# Patient Record
Sex: Male | Born: 1935 | Race: White | Hispanic: No | Marital: Married | Smoking: Former smoker
Health system: Southern US, Community
[De-identification: ages and names within clinical notes are randomized; demographics above are authoritative.]

## PROBLEM LIST (undated history)

## (undated) DIAGNOSIS — I10 Essential (primary) hypertension: Secondary | ICD-10-CM

## (undated) DIAGNOSIS — M109 Gout, unspecified: Secondary | ICD-10-CM

## (undated) DIAGNOSIS — G709 Myoneural disorder, unspecified: Secondary | ICD-10-CM

## (undated) DIAGNOSIS — R0989 Other specified symptoms and signs involving the circulatory and respiratory systems: Secondary | ICD-10-CM

## (undated) DIAGNOSIS — R112 Nausea with vomiting, unspecified: Secondary | ICD-10-CM

## (undated) DIAGNOSIS — G629 Polyneuropathy, unspecified: Secondary | ICD-10-CM

## (undated) DIAGNOSIS — E119 Type 2 diabetes mellitus without complications: Secondary | ICD-10-CM

## (undated) DIAGNOSIS — I739 Peripheral vascular disease, unspecified: Secondary | ICD-10-CM

## (undated) DIAGNOSIS — L309 Dermatitis, unspecified: Secondary | ICD-10-CM

## (undated) DIAGNOSIS — E785 Hyperlipidemia, unspecified: Secondary | ICD-10-CM

## (undated) DIAGNOSIS — H3530 Unspecified macular degeneration: Secondary | ICD-10-CM

## (undated) DIAGNOSIS — G4730 Sleep apnea, unspecified: Secondary | ICD-10-CM

## (undated) DIAGNOSIS — Z9889 Other specified postprocedural states: Secondary | ICD-10-CM

## (undated) DIAGNOSIS — I639 Cerebral infarction, unspecified: Secondary | ICD-10-CM

## (undated) DIAGNOSIS — K635 Polyp of colon: Secondary | ICD-10-CM

## (undated) DIAGNOSIS — R054 Cough syncope: Secondary | ICD-10-CM

## (undated) DIAGNOSIS — R51 Headache: Secondary | ICD-10-CM

## (undated) DIAGNOSIS — R05 Cough: Secondary | ICD-10-CM

## (undated) HISTORY — PX: TONSILLECTOMY: SUR1361

## (undated) HISTORY — DX: Other specified symptoms and signs involving the circulatory and respiratory systems: R09.89

## (undated) HISTORY — DX: Polyneuropathy, unspecified: G62.9

## (undated) HISTORY — PX: COLONOSCOPY: SHX174

## (undated) HISTORY — DX: Cough syncope: R05.4

## (undated) HISTORY — PX: SPINE SURGERY: SHX786

## (undated) HISTORY — DX: Gout, unspecified: M10.9

## (undated) HISTORY — DX: Type 2 diabetes mellitus without complications: E11.9

## (undated) HISTORY — DX: Dermatitis, unspecified: L30.9

## (undated) HISTORY — DX: Unspecified macular degeneration: H35.30

## (undated) HISTORY — DX: Cough: R05

## (undated) HISTORY — DX: Essential (primary) hypertension: I10

## (undated) HISTORY — DX: Polyp of colon: K63.5

## (undated) HISTORY — DX: Peripheral vascular disease, unspecified: I73.9

## (undated) HISTORY — DX: Hyperlipidemia, unspecified: E78.5

## (undated) HISTORY — PX: UMBILICAL HERNIA REPAIR: SHX196

## (undated) HISTORY — PX: TRIGGER FINGER RELEASE: SHX641

## (undated) HISTORY — PX: SHOULDER SURGERY: SHX246

## (undated) HISTORY — DX: Cerebral infarction, unspecified: I63.9

---

## 2006-08-11 HISTORY — PX: CATARACT EXTRACTION: SUR2

## 2008-10-02 ENCOUNTER — Encounter: Admission: RE | Admit: 2008-10-02 | Discharge: 2008-10-02 | Payer: Self-pay | Admitting: Orthopedic Surgery

## 2008-10-05 ENCOUNTER — Ambulatory Visit (HOSPITAL_BASED_OUTPATIENT_CLINIC_OR_DEPARTMENT_OTHER): Admission: RE | Admit: 2008-10-05 | Discharge: 2008-10-05 | Payer: Self-pay | Admitting: Orthopedic Surgery

## 2009-08-11 HISTORY — PX: EYE SURGERY: SHX253

## 2010-04-04 ENCOUNTER — Encounter: Admission: RE | Admit: 2010-04-04 | Discharge: 2010-04-04 | Payer: Self-pay | Admitting: Orthopedic Surgery

## 2010-04-09 ENCOUNTER — Ambulatory Visit (HOSPITAL_BASED_OUTPATIENT_CLINIC_OR_DEPARTMENT_OTHER): Admission: RE | Admit: 2010-04-09 | Discharge: 2010-04-10 | Payer: Self-pay | Admitting: Orthopedic Surgery

## 2010-10-25 LAB — BASIC METABOLIC PANEL
CO2: 25 mEq/L (ref 19–32)
Calcium: 10 mg/dL (ref 8.4–10.5)
Creatinine, Ser: 0.97 mg/dL (ref 0.4–1.5)
GFR calc Af Amer: >60 mL/min (ref >60)
Glucose, Bld: 77 mg/dL (ref 70–99)
Potassium: 4.4 mEq/L (ref 3.5–5.1)
Sodium: 139 mEq/L (ref 135–145)

## 2010-10-25 LAB — POCT HEMOGLOBIN-HEMACUE: Hemoglobin: 16.4 g/dL (ref 13.0–17.0)

## 2010-10-25 LAB — GLUCOSE, CAPILLARY
Glucose-Capillary: 108 mg/dL — ABNORMAL HIGH (ref 70–99)
Glucose-Capillary: 145 mg/dL — ABNORMAL HIGH (ref 70–99)

## 2010-11-26 LAB — BASIC METABOLIC PANEL
CO2: 26 mEq/L (ref 19–32)
Chloride: 107 mEq/L (ref 96–112)
Potassium: 3.7 mEq/L (ref 3.5–5.1)

## 2010-11-26 LAB — POCT HEMOGLOBIN-HEMACUE: Hemoglobin: 16.6 g/dL (ref 13.0–17.0)

## 2010-12-24 NOTE — Op Note (Signed)
NAME:  Caleb Barrett, DAHLEM NO.:  [id number]   MEDICAL RECORD NO.:  [id number]          PATIENT TYPE:  AMB   LOCATION:  DSC                          FACILITY:  [facility]   PHYSICIAN:  Katy Fitch. Sypher, M.D. DATE OF BIRTH:  1935-02-27   DATE OF PROCEDURE:  10/05/2008  DATE OF DISCHARGE:                               OPERATIVE REPORT   PREOPERATIVE DIAGNOSIS:  Chronic stenosing tenosynovitis, left thumb at  A1 pulley.   POSTOPERATIVE DIAGNOSIS:  Chronic stenosing tenosynovitis, left thumb at  A1 pulley.   OPERATION:  Release of left thumb A1 pulley.   OPERATING SURGEON:  Katy Fitch. Sypher, MD.   ASSISTANT:  Marveen Reeks Dasnoit, PA-C   ANESTHESIA:  Lidocaine 2% field block and flexor sheath block of left  thumb supplemented by IV sedation.   SUPERVISING ANESTHESIOLOGIST:  Zenon Mayo, MD   INDICATIONS:  Chevy Sweigert is a 76 year old gentleman referred through  the courtesy of his primary care physician in [place] for  evaluation and management of a locking left thumb.  He has a history of  penicillin allergy and chronic stenosing tenosynovitis.  He has failed  nonoperative measures including steroid injection and activity  modification.   He is now brought to the operating room at this time for release of his  left thumb A1 pulley.   PROCEDURE:  Willies Laviolette was brought to the operating room and placed  in supine position on the operating table.   Following light sedation, the left arm was prepped with Betadine soap  and solution, sterilely draped.  A pneumatic tourniquet was applied to  proximal left brachium.   Lidocaine 2% was infiltrated in the path of the intended incision  followed by exsanguination of left arm with Esmarch bandage, inflation  of arterial tourniquet to 275 mmHg due to mild systolic hypertension.   The procedure commenced with a short transverse incision directly over  the A1 pulley.  Inflammatory tissue was cleared off the  pulley and a  blunt rag nail retractor was placed to retract the radial proper digital  nerve.  The pulley was thickened and opaque.  The pulley was split with  scalpel and scissors.  The flexor pollicis longus tendon was delivered  with minor fraying due to chronic compression.   The wound was then repaired with interrupted suture of 5-0 nylon.  A  compressive dressing was applied with Steri-Strips, sterile gauze, and  Ace wrap.   We will encourage Mr. Abdon to begin immediate range of motion  exercises.  We will see him in 1 week for suture removal.  For  aftercare, he is provided a prescription for Darvocet-N 100 one p.o. q.4-  6 h. p.r.n. pain, 20 tablets without refill.      Katy Fitch Sypher, M.D.  Electronically Signed     RVS/MEDQ  D:  10/05/2008  T:  10/05/2008  Job:  [id number]

## 2011-12-29 DIAGNOSIS — R0989 Other specified symptoms and signs involving the circulatory and respiratory systems: Secondary | ICD-10-CM

## 2011-12-29 HISTORY — DX: Other specified symptoms and signs involving the circulatory and respiratory systems: R09.89

## 2012-09-14 ENCOUNTER — Other Ambulatory Visit: Payer: Self-pay | Admitting: *Deleted

## 2012-09-14 DIAGNOSIS — M79609 Pain in unspecified limb: Secondary | ICD-10-CM

## 2012-09-14 DIAGNOSIS — R55 Syncope and collapse: Secondary | ICD-10-CM

## 2012-09-15 ENCOUNTER — Other Ambulatory Visit: Payer: Self-pay

## 2012-09-15 ENCOUNTER — Encounter: Payer: Self-pay | Admitting: Vascular Surgery

## 2012-09-17 ENCOUNTER — Encounter: Payer: Self-pay | Admitting: Vascular Surgery

## 2012-09-21 ENCOUNTER — Encounter: Payer: Self-pay | Admitting: Vascular Surgery

## 2012-09-22 ENCOUNTER — Other Ambulatory Visit: Payer: Self-pay

## 2012-09-22 ENCOUNTER — Encounter: Payer: Self-pay | Admitting: Vascular Surgery

## 2012-10-27 ENCOUNTER — Encounter: Payer: Self-pay | Admitting: Vascular Surgery

## 2012-10-27 ENCOUNTER — Other Ambulatory Visit: Payer: Self-pay

## 2012-11-15 ENCOUNTER — Encounter: Payer: Self-pay | Admitting: Vascular Surgery

## 2012-11-16 ENCOUNTER — Encounter (INDEPENDENT_AMBULATORY_CARE_PROVIDER_SITE_OTHER): Payer: Medicare Other | Admitting: *Deleted

## 2012-11-16 ENCOUNTER — Other Ambulatory Visit (INDEPENDENT_AMBULATORY_CARE_PROVIDER_SITE_OTHER): Payer: Medicare Other | Admitting: *Deleted

## 2012-11-16 ENCOUNTER — Ambulatory Visit (INDEPENDENT_AMBULATORY_CARE_PROVIDER_SITE_OTHER): Payer: Medicare Other | Admitting: Vascular Surgery

## 2012-11-16 ENCOUNTER — Encounter: Payer: Self-pay | Admitting: Vascular Surgery

## 2012-11-16 VITALS — BP 91/69 | HR 92 | Wt 241.8 lb

## 2012-11-16 DIAGNOSIS — R55 Syncope and collapse: Secondary | ICD-10-CM

## 2012-11-16 DIAGNOSIS — M79609 Pain in unspecified limb: Secondary | ICD-10-CM

## 2012-11-16 NOTE — Addendum Note (Signed)
Addended by: Adria Dill L on: 11/16/2012 04:28 PM   Modules accepted: Orders
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
## 2013-05-11 HISTORY — PX: BACK SURGERY: SHX140

## 2013-05-18 ENCOUNTER — Other Ambulatory Visit: Payer: Self-pay | Admitting: Neurosurgery

## 2013-05-23 ENCOUNTER — Encounter (HOSPITAL_COMMUNITY): Payer: Self-pay | Admitting: Pharmacy Technician

## 2013-05-25 ENCOUNTER — Other Ambulatory Visit (HOSPITAL_COMMUNITY): Payer: Self-pay | Admitting: *Deleted

## 2013-05-25 NOTE — Pre-Procedure Instructions (Signed)
Caleb Barrett  05/25/2013   Your procedure is scheduled on:  Thursday, June 02, 2013 at 12:15 PM.   Report to [facility] Entrance "A" at 9:15 AM.   Call this number if you have problems the morning of surgery: [phone]   Remember:   Do not eat food or drink liquids after midnight.   Take these medicines the morning of surgery with A SIP OF WATER: gabapentin (NEURONTIN)  Stop all Vitamins, Herbal Medications, Fish Oil, Aspirin, Plavix and NSAIDS as of today, 05/26/13.     Do not wear jewelry.  Do not wear lotions, powders, or cologne. You may wear deodorant.  Do not shave 48 hours prior to surgery. Men may shave face and neck.  Do not bring valuables to the hospital.  [facility] is not responsible                  for any belongings or valuables.               Contacts, dentures or bridgework may not be worn into surgery.  Leave suitcase in the car. After surgery it may be brought to your room.  For patients admitted to the hospital, discharge time is determined by your                treatment team.                Special Instructions: Shower using CHG 2 nights before surgery and the night before surgery.  If you shower the day of surgery use CHG.  Use special wash - you have one bottle of CHG for all showers.  You should use approximately 1/3 of the bottle for each shower.   Please read over the following fact sheets that you were given: Pain Booklet, Coughing and Deep Breathing, Blood Transfusion Information, MRSA Information and Surgical Site Infection Prevention

## 2013-05-26 ENCOUNTER — Encounter (HOSPITAL_COMMUNITY): Payer: Self-pay

## 2013-05-26 ENCOUNTER — Encounter (HOSPITAL_COMMUNITY)
Admission: RE | Admit: 2013-05-26 | Discharge: 2013-05-26 | Disposition: A | Discharge status: Home / Self Care | Payer: Medicare Other | Source: Ambulatory Visit | Attending: Neurosurgery | Admitting: Neurosurgery

## 2013-05-26 DIAGNOSIS — Z01810 Encounter for preprocedural cardiovascular examination: Secondary | ICD-10-CM | POA: Insufficient documentation

## 2013-05-26 DIAGNOSIS — Z01812 Encounter for preprocedural laboratory examination: Secondary | ICD-10-CM | POA: Insufficient documentation

## 2013-05-26 DIAGNOSIS — Z01818 Encounter for other preprocedural examination: Secondary | ICD-10-CM | POA: Insufficient documentation

## 2013-05-26 HISTORY — DX: Myoneural disorder, unspecified: G70.9

## 2013-05-26 HISTORY — DX: Sleep apnea, unspecified: G47.30

## 2013-05-26 HISTORY — DX: Other specified postprocedural states: R11.2

## 2013-05-26 HISTORY — DX: Headache: R51

## 2013-05-26 HISTORY — DX: Other specified postprocedural states: Z98.890

## 2013-05-26 LAB — BASIC METABOLIC PANEL
BUN: 20 mg/dL (ref 6–23)
CO2: 27 mEq/L (ref 19–32)
Calcium: 10.3 mg/dL (ref 8.4–10.5)
Chloride: 98 mEq/L (ref 96–112)
Creatinine, Ser: 1.12 mg/dL (ref 0.50–1.35)
GFR calc Af Amer: 72 mL/min — ABNORMAL LOW (ref >90)
GFR calc non Af Amer: 62 mL/min — ABNORMAL LOW (ref >90)
Potassium: 4.3 mEq/L (ref 3.5–5.1)

## 2013-05-26 LAB — CBC
HCT: 48.2 % (ref 39.0–52.0)
Hemoglobin: 16.2 g/dL (ref 13.0–17.0)
MCHC: 33.6 g/dL (ref 30.0–36.0)
MCV: 86.8 fL (ref 78.0–100.0)
Platelets: 161 10*3/uL (ref 150–400)
RBC: 5.55 MIL/uL (ref 4.22–5.81)
RDW: 14.3 % (ref 11.5–15.5)
WBC: 8.1 10*3/uL (ref 4.0–10.5)

## 2013-05-26 LAB — TYPE AND SCREEN
ABO/RH(D): O POS
Antibody Screen: NEGATIVE

## 2013-05-26 LAB — SURGICAL PCR SCREEN
MRSA, PCR: NEGATIVE
Staphylococcus aureus: NEGATIVE

## 2013-05-26 LAB — ABO/RH: ABO/RH(D): O POS

## 2013-05-26 NOTE — Progress Notes (Addendum)
PCP Dr Harl Bowie.  Denies seeing a Cardiologist.  Denies having a card cath, echo, or stress test.   No recent EKG or CXR noted. Voices understanding of pre-admit instructions.

## 2013-05-27 ENCOUNTER — Encounter (HOSPITAL_COMMUNITY): Payer: Self-pay

## 2013-05-27 NOTE — Progress Notes (Signed)
Anesthesia Chart Review:  Patient is a 78 year old male scheduled for L2-5 decompression/PLIF on 06/02/13 by Dr. Venetia Maxon.    History includes former smoker, obesity, HLD, HTN, DM2, peripheral neuropathy, CVA, OSA, cough syncope '12, macular degeneration, PAD with occluded left ICA (by notes), gout, right eye cataract extraction, tonsillectomy, UHR, right rotator cuff repair '11.  Negative stress echo in 2003 by PCP notes. PCP is Dr. Lasandra Beech.  He is followed by neurologist Dr. Aletha Halim at [facility] who cleared him for this procedure with permission to hold Plavix.  EKG on 05/26/13 showed NSR, possible anterior infarct (age undetermined). His rate was increased and r wave in V3 is lower when compared to 04/04/10.  PCP notes from 04/29/13 state his carotid duplex on 12/29/11 ws unchanged--showing occluded left ICA.  (Copy of latest report requested, but is currently pending.)  CXR on 05/26/13 showed findings consistent with COPD, no acute abnormality.  Preoperative labs noted.   He has been cleared by his neurologist.  No CV symptoms documented at his PAT visit. Further evaluation by his assigned anesthesiologist on the day of surgery, but if no acute changes then I would anticipate that he could proceed as planned.  Velna Ochs [facility] Short Stay Center/Anesthesiology Phone [phone] 05/27/2013 4:12 PM

## 2013-06-01 MED ORDER — VANCOMYCIN HCL 10 G IV SOLR
1500.0000 mg | INTRAVENOUS | Status: AC
  Administered 2013-06-02: 1500 mg via INTRAVENOUS
  Filled 2013-06-01 (×2): qty 1500

## 2013-06-02 ENCOUNTER — Ambulatory Visit (HOSPITAL_COMMUNITY): Payer: Medicare Other

## 2013-06-02 ENCOUNTER — Encounter (HOSPITAL_COMMUNITY)
Admission: RE | Disposition: A | Discharge status: Home Health | Payer: Medicare HMO | Source: Ambulatory Visit | Attending: Neurosurgery

## 2013-06-02 ENCOUNTER — Inpatient Hospital Stay (HOSPITAL_COMMUNITY)
Admission: RE | Admit: 2013-06-02 | Discharge: 2013-06-07 | DRG: 460 | Disposition: A | Discharge status: Home Health | Payer: Medicare Other | Source: Ambulatory Visit | Attending: Neurosurgery | Admitting: Neurosurgery

## 2013-06-02 ENCOUNTER — Encounter (HOSPITAL_COMMUNITY): Payer: Self-pay | Admitting: Anesthesiology

## 2013-06-02 ENCOUNTER — Encounter (HOSPITAL_COMMUNITY): Payer: Medicare Other | Admitting: Vascular Surgery

## 2013-06-02 ENCOUNTER — Ambulatory Visit (HOSPITAL_COMMUNITY): Payer: Medicare Other | Admitting: Anesthesiology

## 2013-06-02 DIAGNOSIS — I658 Occlusion and stenosis of other precerebral arteries: Secondary | ICD-10-CM | POA: Diagnosis present

## 2013-06-02 DIAGNOSIS — Z79899 Other long term (current) drug therapy: Secondary | ICD-10-CM

## 2013-06-02 DIAGNOSIS — G4730 Sleep apnea, unspecified: Secondary | ICD-10-CM | POA: Diagnosis present

## 2013-06-02 DIAGNOSIS — Z87891 Personal history of nicotine dependence: Secondary | ICD-10-CM

## 2013-06-02 DIAGNOSIS — I6529 Occlusion and stenosis of unspecified carotid artery: Secondary | ICD-10-CM | POA: Diagnosis present

## 2013-06-02 DIAGNOSIS — E669 Obesity, unspecified: Secondary | ICD-10-CM | POA: Diagnosis present

## 2013-06-02 DIAGNOSIS — I10 Essential (primary) hypertension: Secondary | ICD-10-CM | POA: Diagnosis present

## 2013-06-02 DIAGNOSIS — Z7902 Long term (current) use of antithrombotics/antiplatelets: Secondary | ICD-10-CM

## 2013-06-02 DIAGNOSIS — E119 Type 2 diabetes mellitus without complications: Secondary | ICD-10-CM | POA: Diagnosis present

## 2013-06-02 DIAGNOSIS — M47817 Spondylosis without myelopathy or radiculopathy, lumbosacral region: Secondary | ICD-10-CM | POA: Diagnosis present

## 2013-06-02 DIAGNOSIS — Z7982 Long term (current) use of aspirin: Secondary | ICD-10-CM

## 2013-06-02 DIAGNOSIS — R339 Retention of urine, unspecified: Secondary | ICD-10-CM | POA: Diagnosis not present

## 2013-06-02 DIAGNOSIS — Z8673 Personal history of transient ischemic attack (TIA), and cerebral infarction without residual deficits: Secondary | ICD-10-CM

## 2013-06-02 DIAGNOSIS — M4310 Spondylolisthesis, site unspecified: Principal | ICD-10-CM | POA: Diagnosis present

## 2013-06-02 DIAGNOSIS — E785 Hyperlipidemia, unspecified: Secondary | ICD-10-CM | POA: Diagnosis present

## 2013-06-02 LAB — GLUCOSE, CAPILLARY
Glucose-Capillary: 97 mg/dL (ref 70–99)
Glucose-Capillary: 113 mg/dL — ABNORMAL HIGH (ref 70–99)

## 2013-06-02 SURGERY — POSTERIOR LUMBAR FUSION 3 LEVEL
Anesthesia: General | Site: Spine Lumbar | Wound class: Clean

## 2013-06-02 MED ORDER — MIDAZOLAM HCL 2 MG/2ML IJ SOLN
0.5000 mg | Freq: Once | INTRAMUSCULAR | Status: AC | PRN
  Administered 2013-06-02: 2 mg via INTRAVENOUS

## 2013-06-02 MED ORDER — FLEET ENEMA 7-19 GM/118ML RE ENEM: 1.0000 | ENEMA | Freq: Once | RECTAL | Status: AC | PRN

## 2013-06-02 MED ORDER — CLOPIDOGREL BISULFATE 75 MG PO TABS
75.0000 mg | ORAL_TABLET | Freq: Every day | ORAL | Status: DC
  Administered 2013-06-02 – 2013-06-07 (×6): 75 mg via ORAL
  Filled 2013-06-02 (×6): qty 1

## 2013-06-02 MED ORDER — SODIUM CHLORIDE 0.9 % IJ SOLN: 3.0000 mL | INTRAMUSCULAR | Status: DC | PRN

## 2013-06-02 MED ORDER — ALBUMIN HUMAN 5 % IV SOLN
INTRAVENOUS | Status: DC | PRN
  Administered 2013-06-02: 15:00:00 via INTRAVENOUS

## 2013-06-02 MED ORDER — ONDANSETRON HCL 4 MG/2ML IJ SOLN
INTRAMUSCULAR | Status: DC | PRN
  Administered 2013-06-02: 4 mg via INTRAVENOUS

## 2013-06-02 MED ORDER — MEPERIDINE HCL 25 MG/ML IJ SOLN: 6.2500 mg | INTRAMUSCULAR | Status: DC | PRN

## 2013-06-02 MED ORDER — LACTATED RINGERS IV SOLN
INTRAVENOUS | Status: DC | PRN
  Administered 2013-06-02 (×3): via INTRAVENOUS

## 2013-06-02 MED ORDER — PROMETHAZINE HCL 25 MG/ML IJ SOLN: 6.2500 mg | INTRAMUSCULAR | Status: DC | PRN

## 2013-06-02 MED ORDER — INSULIN ASPART 100 UNIT/ML ~~LOC~~ SOLN: 0.0000 [IU] | Freq: Every day | SUBCUTANEOUS | Status: DC

## 2013-06-02 MED ORDER — POLYETHYLENE GLYCOL 3350 17 G PO PACK
17.0000 g | PACK | Freq: Every day | ORAL | Status: DC | PRN
  Administered 2013-06-05 – 2013-06-07 (×2): 17 g via ORAL
  Filled 2013-06-02 (×2): qty 1

## 2013-06-02 MED ORDER — LIDOCAINE HCL (CARDIAC) 20 MG/ML IV SOLN
INTRAVENOUS | Status: DC | PRN
  Administered 2013-06-02: 100 mg via INTRAVENOUS

## 2013-06-02 MED ORDER — BUPIVACAINE HCL (PF) 0.5 % IJ SOLN
INTRAMUSCULAR | Status: DC | PRN
  Administered 2013-06-02: 5 mL

## 2013-06-02 MED ORDER — SODIUM CHLORIDE 0.9 % IV SOLN
INTRAVENOUS | Status: DC | PRN
  Administered 2013-06-02: 12:00:00 via INTRAVENOUS

## 2013-06-02 MED ORDER — ASPIRIN 81 MG PO TABS: 81.0000 mg | ORAL_TABLET | Freq: Every day | ORAL | Status: DC

## 2013-06-02 MED ORDER — LISINOPRIL-HYDROCHLOROTHIAZIDE 20-12.5 MG PO TABS: 2.0000 | ORAL_TABLET | Freq: Every day | ORAL | Status: DC

## 2013-06-02 MED ORDER — PANTOPRAZOLE SODIUM 40 MG IV SOLR
40.0000 mg | Freq: Every day | INTRAVENOUS | Status: DC
  Administered 2013-06-02: 40 mg via INTRAVENOUS
  Filled 2013-06-02 (×2): qty 40

## 2013-06-02 MED ORDER — NEOSTIGMINE METHYLSULFATE 1 MG/ML IJ SOLN
INTRAMUSCULAR | Status: DC | PRN
  Administered 2013-06-02: 5 mg via INTRAVENOUS

## 2013-06-02 MED ORDER — DIAZEPAM 5 MG PO TABS
5.0000 mg | ORAL_TABLET | Freq: Four times a day (QID) | ORAL | Status: DC | PRN
  Administered 2013-06-02 – 2013-06-07 (×7): 5 mg via ORAL
  Filled 2013-06-02 (×7): qty 1

## 2013-06-02 MED ORDER — MORPHINE SULFATE 2 MG/ML IJ SOLN
1.0000 mg | INTRAMUSCULAR | Status: DC | PRN
  Administered 2013-06-02: 2 mg via INTRAVENOUS
  Administered 2013-06-02: 4 mg via INTRAVENOUS
  Administered 2013-06-05: 2 mg via INTRAVENOUS
  Filled 2013-06-02 (×2): qty 1
  Filled 2013-06-02: qty 2

## 2013-06-02 MED ORDER — DOCUSATE SODIUM 100 MG PO CAPS
100.0000 mg | ORAL_CAPSULE | Freq: Two times a day (BID) | ORAL | Status: DC
  Administered 2013-06-02 – 2013-06-07 (×9): 100 mg via ORAL
  Filled 2013-06-02 (×10): qty 1

## 2013-06-02 MED ORDER — INSULIN ASPART 100 UNIT/ML ~~LOC~~ SOLN
0.0000 [IU] | Freq: Three times a day (TID) | SUBCUTANEOUS | Status: DC
  Administered 2013-06-03: 3 [IU] via SUBCUTANEOUS
  Administered 2013-06-03 – 2013-06-06 (×6): 2 [IU] via SUBCUTANEOUS

## 2013-06-02 MED ORDER — SIMVASTATIN 20 MG PO TABS
20.0000 mg | ORAL_TABLET | Freq: Every day | ORAL | Status: DC
  Administered 2013-06-02 – 2013-06-06 (×5): 20 mg via ORAL
  Filled 2013-06-02 (×6): qty 1

## 2013-06-02 MED ORDER — SODIUM CHLORIDE 0.9 % IV SOLN
250.0000 mL | INTRAVENOUS | Status: DC
  Administered 2013-06-06: 250 mL via INTRAVENOUS

## 2013-06-02 MED ORDER — DIAZEPAM 5 MG PO TABS
ORAL_TABLET | ORAL | Status: AC
  Administered 2013-06-02: 5 mg via ORAL
  Filled 2013-06-02: qty 1

## 2013-06-02 MED ORDER — OXYCODONE HCL 5 MG/5ML PO SOLN: 5.0000 mg | Freq: Once | ORAL | Status: DC | PRN

## 2013-06-02 MED ORDER — METFORMIN HCL 500 MG PO TABS
500.0000 mg | ORAL_TABLET | Freq: Every day | ORAL | Status: DC
  Administered 2013-06-03 – 2013-06-07 (×5): 500 mg via ORAL
  Filled 2013-06-02 (×6): qty 1

## 2013-06-02 MED ORDER — PHENOL 1.4 % MT LIQD: 1.0000 | OROMUCOSAL | Status: DC | PRN

## 2013-06-02 MED ORDER — ACETAMINOPHEN 650 MG RE SUPP: 650.0000 mg | RECTAL | Status: DC | PRN

## 2013-06-02 MED ORDER — VANCOMYCIN HCL IN DEXTROSE 1-5 GM/200ML-% IV SOLN
1000.0000 mg | Freq: Two times a day (BID) | INTRAVENOUS | Status: AC
  Administered 2013-06-03 (×2): 1000 mg via INTRAVENOUS
  Filled 2013-06-02 (×3): qty 200

## 2013-06-02 MED ORDER — THROMBIN 20000 UNITS EX SOLR
CUTANEOUS | Status: DC | PRN
  Administered 2013-06-02: 14:00:00 via TOPICAL

## 2013-06-02 MED ORDER — ALLOPURINOL 300 MG PO TABS
300.0000 mg | ORAL_TABLET | Freq: Every day | ORAL | Status: DC
  Administered 2013-06-02 – 2013-06-07 (×6): 300 mg via ORAL
  Filled 2013-06-02 (×6): qty 1

## 2013-06-02 MED ORDER — HYDROMORPHONE HCL PF 1 MG/ML IJ SOLN
0.2500 mg | INTRAMUSCULAR | Status: DC | PRN
  Administered 2013-06-02: 0.5 mg via INTRAVENOUS

## 2013-06-02 MED ORDER — 0.9 % SODIUM CHLORIDE (POUR BTL) OPTIME
TOPICAL | Status: DC | PRN
  Administered 2013-06-02 (×2): 1000 mL

## 2013-06-02 MED ORDER — LISINOPRIL 40 MG PO TABS
40.0000 mg | ORAL_TABLET | Freq: Every day | ORAL | Status: DC
  Administered 2013-06-02 – 2013-06-07 (×4): 40 mg via ORAL
  Filled 2013-06-02 (×6): qty 1

## 2013-06-02 MED ORDER — GLYCOPYRROLATE 0.2 MG/ML IJ SOLN
INTRAMUSCULAR | Status: DC | PRN
  Administered 2013-06-02: .6 mg via INTRAVENOUS

## 2013-06-02 MED ORDER — ZOLPIDEM TARTRATE 5 MG PO TABS: 5.0000 mg | ORAL_TABLET | Freq: Every evening | ORAL | Status: DC | PRN

## 2013-06-02 MED ORDER — SODIUM CHLORIDE 0.9 % IJ SOLN
3.0000 mL | Freq: Two times a day (BID) | INTRAMUSCULAR | Status: DC
  Administered 2013-06-02 – 2013-06-07 (×4): 3 mL via INTRAVENOUS

## 2013-06-02 MED ORDER — GABAPENTIN 300 MG PO CAPS
300.0000 mg | ORAL_CAPSULE | Freq: Two times a day (BID) | ORAL | Status: DC
  Administered 2013-06-02 – 2013-06-07 (×10): 300 mg via ORAL
  Filled 2013-06-02 (×12): qty 1

## 2013-06-02 MED ORDER — BISACODYL 10 MG RE SUPP
10.0000 mg | Freq: Every day | RECTAL | Status: DC | PRN
  Administered 2013-06-07: 10 mg via RECTAL
  Filled 2013-06-02: qty 1

## 2013-06-02 MED ORDER — LACTATED RINGERS IV SOLN
INTRAVENOUS | Status: DC
  Administered 2013-06-02: 09:00:00 via INTRAVENOUS

## 2013-06-02 MED ORDER — ONDANSETRON HCL 4 MG/2ML IJ SOLN
4.0000 mg | INTRAMUSCULAR | Status: DC | PRN
  Administered 2013-06-03: 4 mg via INTRAVENOUS
  Filled 2013-06-02: qty 2

## 2013-06-02 MED ORDER — MIDAZOLAM HCL 2 MG/2ML IJ SOLN
INTRAMUSCULAR | Status: AC
  Administered 2013-06-02: 2 mg via INTRAVENOUS
  Filled 2013-06-02: qty 2

## 2013-06-02 MED ORDER — ARTIFICIAL TEARS OP OINT
TOPICAL_OINTMENT | OPHTHALMIC | Status: DC | PRN
  Administered 2013-06-02: 1 via OPHTHALMIC

## 2013-06-02 MED ORDER — LIDOCAINE-EPINEPHRINE 1 %-1:100000 IJ SOLN
INTRAMUSCULAR | Status: DC | PRN
  Administered 2013-06-02: 5 mL

## 2013-06-02 MED ORDER — ALUM & MAG HYDROXIDE-SIMETH 200-200-20 MG/5ML PO SUSP: 30.0000 mL | Freq: Four times a day (QID) | ORAL | Status: DC | PRN

## 2013-06-02 MED ORDER — OXYCODONE HCL 5 MG PO TABS: 5.0000 mg | ORAL_TABLET | Freq: Once | ORAL | Status: DC | PRN

## 2013-06-02 MED ORDER — HYDROCHLOROTHIAZIDE 25 MG PO TABS
25.0000 mg | ORAL_TABLET | Freq: Every day | ORAL | Status: DC
  Administered 2013-06-02 – 2013-06-07 (×4): 25 mg via ORAL
  Filled 2013-06-02 (×6): qty 1

## 2013-06-02 MED ORDER — PROPOFOL 10 MG/ML IV BOLUS
INTRAVENOUS | Status: DC | PRN
  Administered 2013-06-02: 150 mg via INTRAVENOUS

## 2013-06-02 MED ORDER — PHENYLEPHRINE HCL 10 MG/ML IJ SOLN
10.0000 mg | INTRAVENOUS | Status: DC | PRN
  Administered 2013-06-02: 10 ug/min via INTRAVENOUS

## 2013-06-02 MED ORDER — ALBUTEROL SULFATE HFA 108 (90 BASE) MCG/ACT IN AERS
INHALATION_SPRAY | RESPIRATORY_TRACT | Status: DC | PRN
  Administered 2013-06-02: 2 via RESPIRATORY_TRACT

## 2013-06-02 MED ORDER — OXYCODONE-ACETAMINOPHEN 5-325 MG PO TABS
ORAL_TABLET | ORAL | Status: AC
  Administered 2013-06-02: 1 via ORAL
  Filled 2013-06-02: qty 1

## 2013-06-02 MED ORDER — HYDROMORPHONE HCL PF 1 MG/ML IJ SOLN
INTRAMUSCULAR | Status: AC
  Administered 2013-06-02: 0.5 mg via INTRAVENOUS
  Filled 2013-06-02: qty 1

## 2013-06-02 MED ORDER — ASPIRIN 81 MG PO CHEW
81.0000 mg | CHEWABLE_TABLET | Freq: Every day | ORAL | Status: DC
  Administered 2013-06-03 – 2013-06-07 (×5): 81 mg via ORAL
  Filled 2013-06-02 (×5): qty 1

## 2013-06-02 MED ORDER — HYDROCODONE-ACETAMINOPHEN 5-325 MG PO TABS
1.0000 | ORAL_TABLET | ORAL | Status: DC | PRN
  Administered 2013-06-03: 1 via ORAL
  Administered 2013-06-04 – 2013-06-07 (×8): 2 via ORAL
  Filled 2013-06-02 (×7): qty 2
  Filled 2013-06-02: qty 1
  Filled 2013-06-02: qty 2

## 2013-06-02 MED ORDER — KCL IN DEXTROSE-NACL 20-5-0.45 MEQ/L-%-% IV SOLN
INTRAVENOUS | Status: DC
  Administered 2013-06-02 – 2013-06-07 (×6): via INTRAVENOUS
  Filled 2013-06-02 (×11): qty 1000

## 2013-06-02 MED ORDER — OXYCODONE-ACETAMINOPHEN 5-325 MG PO TABS
1.0000 | ORAL_TABLET | ORAL | Status: DC | PRN
  Administered 2013-06-02: 1 via ORAL
  Administered 2013-06-03 – 2013-06-07 (×9): 2 via ORAL
  Filled 2013-06-02 (×4): qty 2
  Filled 2013-06-02: qty 1
  Filled 2013-06-02 (×5): qty 2

## 2013-06-02 MED ORDER — ROCURONIUM BROMIDE 100 MG/10ML IV SOLN
INTRAVENOUS | Status: DC | PRN
  Administered 2013-06-02: 20 mg via INTRAVENOUS
  Administered 2013-06-02: 50 mg via INTRAVENOUS
  Administered 2013-06-02 (×3): 10 mg via INTRAVENOUS

## 2013-06-02 MED ORDER — FENTANYL CITRATE 0.05 MG/ML IJ SOLN
INTRAMUSCULAR | Status: DC | PRN
  Administered 2013-06-02 (×2): 50 ug via INTRAVENOUS
  Administered 2013-06-02: 100 ug via INTRAVENOUS
  Administered 2013-06-02: 50 ug via INTRAVENOUS
  Administered 2013-06-02: 200 ug via INTRAVENOUS
  Administered 2013-06-02 (×2): 50 ug via INTRAVENOUS

## 2013-06-02 MED ORDER — ACETAMINOPHEN 325 MG PO TABS: 650.0000 mg | ORAL_TABLET | ORAL | Status: DC | PRN

## 2013-06-02 MED ORDER — SENNA 8.6 MG PO TABS
1.0000 | ORAL_TABLET | Freq: Two times a day (BID) | ORAL | Status: DC
  Administered 2013-06-02 – 2013-06-07 (×10): 8.6 mg via ORAL
  Filled 2013-06-02 (×11): qty 1

## 2013-06-02 MED ORDER — MENTHOL 3 MG MT LOZG: 1.0000 | LOZENGE | OROMUCOSAL | Status: DC | PRN

## 2013-06-02 MED FILL — Heparin Sodium (Porcine) Inj 1000 Unit/ML: INTRAMUSCULAR | Qty: 30 | Status: AC

## 2013-06-02 MED FILL — Sodium Chloride IV Soln 0.9%: INTRAVENOUS | Qty: 1000 | Status: AC

## 2013-06-02 SURGICAL SUPPLY — 85 items
BAG DECANTER FOR FLEXI CONT (MISCELLANEOUS) IMPLANT
BENZOIN TINCTURE PRP APPL 2/3 (GAUZE/BANDAGES/DRESSINGS) IMPLANT
BLADE SURG ROTATE 9660 (MISCELLANEOUS) IMPLANT
BUR MATCHSTICK NEURO 3.0 LAGG (BURR) ×2 IMPLANT
BUR PRECISION FLUTE 5.0 (BURR) ×2 IMPLANT
BUR ROUND FLUTED 5 RND (BURR) ×2 IMPLANT
CANISTER SUCT 3000ML (MISCELLANEOUS) ×2 IMPLANT
CONT SPEC 4OZ CLIKSEAL STRL BL (MISCELLANEOUS) ×4 IMPLANT
COVER BACK TABLE 24X17X13 BIG (DRAPES) IMPLANT
COVER TABLE BACK 60X90 (DRAPES) ×2 IMPLANT
CROSSOVER SMALL (Orthopedic Implant) ×2 IMPLANT
DERMABOND ADHESIVE PROPEN (GAUZE/BANDAGES/DRESSINGS) ×2
DERMABOND ADVANCED (GAUZE/BANDAGES/DRESSINGS)
DERMABOND ADVANCED .7 DNX12 (GAUZE/BANDAGES/DRESSINGS) IMPLANT
DERMABOND ADVANCED .7 DNX6 (GAUZE/BANDAGES/DRESSINGS) ×2 IMPLANT
DRAPE C-ARM 42X72 X-RAY (DRAPES) ×4 IMPLANT
DRAPE LAPAROTOMY 100X72X124 (DRAPES) ×2 IMPLANT
DRAPE POUCH INSTRU U-SHP 10X18 (DRAPES) ×2 IMPLANT
DRAPE SURG 17X23 STRL (DRAPES) ×2 IMPLANT
DRESSING TELFA 8X3 (GAUZE/BANDAGES/DRESSINGS) IMPLANT
DRSG OPSITE POSTOP 4X8 (GAUZE/BANDAGES/DRESSINGS) ×2 IMPLANT
DURAPREP 26ML APPLICATOR (WOUND CARE) ×2 IMPLANT
ELECT BLADE 4.0 EZ CLEAN MEGAD (MISCELLANEOUS) ×2
ELECT REM PT RETURN 9FT ADLT (ELECTROSURGICAL) ×2
ELECTRODE BLDE 4.0 EZ CLN MEGD (MISCELLANEOUS) ×1 IMPLANT
ELECTRODE REM PT RTRN 9FT ADLT (ELECTROSURGICAL) ×1 IMPLANT
EVACUATOR 1/8 PVC DRAIN (DRAIN) IMPLANT
GAUZE SPONGE 4X4 16PLY XRAY LF (GAUZE/BANDAGES/DRESSINGS) IMPLANT
GLOVE BIO SURGEON STRL SZ7 (GLOVE) ×2 IMPLANT
GLOVE BIO SURGEON STRL SZ8 (GLOVE) ×4 IMPLANT
GLOVE BIOGEL PI IND STRL 6.5 (GLOVE) ×1 IMPLANT
GLOVE BIOGEL PI IND STRL 7.0 (GLOVE) ×1 IMPLANT
GLOVE BIOGEL PI IND STRL 8 (GLOVE) ×4 IMPLANT
GLOVE BIOGEL PI IND STRL 8.5 (GLOVE) ×2 IMPLANT
GLOVE BIOGEL PI INDICATOR 6.5 (GLOVE) ×1
GLOVE BIOGEL PI INDICATOR 7.0 (GLOVE) ×1
GLOVE BIOGEL PI INDICATOR 8 (GLOVE) ×4
GLOVE BIOGEL PI INDICATOR 8.5 (GLOVE) ×2
GLOVE ECLIPSE 7.5 STRL STRAW (GLOVE) ×4 IMPLANT
GLOVE ECLIPSE 8.0 STRL XLNG CF (GLOVE) ×6 IMPLANT
GLOVE EXAM NITRILE LRG STRL (GLOVE) IMPLANT
GLOVE EXAM NITRILE MD LF STRL (GLOVE) IMPLANT
GLOVE EXAM NITRILE XL STR (GLOVE) IMPLANT
GLOVE EXAM NITRILE XS STR PU (GLOVE) IMPLANT
GLOVE SURG SS PI 8.5 STRL IVOR (GLOVE) ×1
GLOVE SURG SS PI 8.5 STRL STRW (GLOVE) ×1 IMPLANT
GOWN BRE IMP SLV AUR LG STRL (GOWN DISPOSABLE) ×2 IMPLANT
GOWN BRE IMP SLV AUR XL STRL (GOWN DISPOSABLE) ×6 IMPLANT
GOWN STRL REIN 2XL LVL4 (GOWN DISPOSABLE) ×8 IMPLANT
KIT BASIN OR (CUSTOM PROCEDURE TRAY) ×2 IMPLANT
KIT INFUSE SMALL (Orthopedic Implant) ×2 IMPLANT
KIT POSITION SURG JACKSON T1 (MISCELLANEOUS) ×2 IMPLANT
KIT ROOM TURNOVER OR (KITS) ×2 IMPLANT
MILL MEDIUM DISP (BLADE) ×2 IMPLANT
NEEDLE HYPO 25X1 1.5 SAFETY (NEEDLE) ×2 IMPLANT
NEEDLE SPNL 18GX3.5 QUINCKE PK (NEEDLE) ×2 IMPLANT
NS IRRIG 1000ML POUR BTL (IV SOLUTION) ×2 IMPLANT
PACK LAMINECTOMY NEURO (CUSTOM PROCEDURE TRAY) ×2 IMPLANT
PAD ARMBOARD 7.5X6 YLW CONV (MISCELLANEOUS) ×6 IMPLANT
PATTIES SURGICAL .5 X.5 (GAUZE/BANDAGES/DRESSINGS) IMPLANT
PATTIES SURGICAL .5 X1 (DISPOSABLE) IMPLANT
PATTIES SURGICAL 1X1 (DISPOSABLE) ×2 IMPLANT
ROD 90MM (Rod) ×2 IMPLANT
SCREW 55MM (Screw) ×2 IMPLANT
SCREW POLYAX 6.5X45MM (Screw) ×14 IMPLANT
SCREW SET SPINAL STD HEXALOBE (Screw) ×16 IMPLANT
SPONGE GAUZE 4X4 12PLY (GAUZE/BANDAGES/DRESSINGS) ×2 IMPLANT
SPONGE LAP 4X18 X RAY DECT (DISPOSABLE) IMPLANT
SPONGE SURGIFOAM ABS GEL 100 (HEMOSTASIS) ×2 IMPLANT
STAPLER SKIN PROX WIDE 3.9 (STAPLE) IMPLANT
STRIP CLOSURE SKIN 1/2X4 (GAUZE/BANDAGES/DRESSINGS) IMPLANT
SUT VIC AB 1 CT1 18XBRD ANBCTR (SUTURE) ×2 IMPLANT
SUT VIC AB 1 CT1 8-18 (SUTURE) ×2
SUT VIC AB 2-0 CT1 18 (SUTURE) ×4 IMPLANT
SUT VIC AB 3-0 SH 8-18 (SUTURE) ×4 IMPLANT
SYR 20CC LL (SYRINGE) ×2 IMPLANT
SYR 3ML LL SCALE MARK (SYRINGE) ×8 IMPLANT
SYR 5ML LL (SYRINGE) ×10 IMPLANT
TAPE CLOTH SURG 4X10 WHT LF (GAUZE/BANDAGES/DRESSINGS) ×2 IMPLANT
TOWEL OR 17X24 6PK STRL BLUE (TOWEL DISPOSABLE) ×2 IMPLANT
TOWEL OR 17X26 10 PK STRL BLUE (TOWEL DISPOSABLE) ×4 IMPLANT
TRAP SPECIMEN MUCOUS 40CC (MISCELLANEOUS) ×2 IMPLANT
TRAY FOLEY CATH 14FRSI W/METER (CATHETERS) IMPLANT
TRAY FOLEY CATH 16FRSI W/METER (SET/KITS/TRAYS/PACK) ×2 IMPLANT
WATER STERILE IRR 1000ML POUR (IV SOLUTION) ×2 IMPLANT

## 2013-06-02 NOTE — H&P (Signed)
> [address] [place], [place] [address] Phone: [phone]   Patient ID:   [id number] Patient: Caleb Barrett  Date of Birth: 1934/09/27 Visit Type: Chart Update   Date: 05/02/2013 11:30 AM Provider: Danae Orleans. Venetia Maxon   Historian: self  This 78 year old male presents for back pain.  HISTORY OF PRESENT ILLNESS: 1.  back pain   78 y.o. male, retired, reports worsening lumbar and BLE pain x72yrs.  Unable to walk >30minutes.  Pt notes increasing weakness BLE. Pt attributes numbness both feet to NIDDM.  ESI's (4) have not helped.   Gabapentin 1200mg  BID helps very little.  NCS 08/2012 MRI 02/2013 & X-ray today on [place].   (Plavix since stroke 5 yrs ago)  Patient complains of low back and bilateral lower chart knee pain.  He says he is unable to walk greater than 10 minutes.  He complains of numbness in her feet.  He notes progressive weakness in both legs over the last year.  He has been taking gabapentin without relief.  He is on Plavix 75 mg daily and has been on this ever since he had a stroke 5 years ago.  On further discussion this sounds like a TIA rather than a stroke as he had no lasting deficit.  He is known to have 100% occlusion of his left carotid artery and 40-50% narrowing of his right carotid artery.  He describes that his problem started in his feet and that he has numbness involving his foot and it is this if he is okay walking on marbles".  He gets relief when he sits down to rest after 10 minutes and when he has severe pain if he leans forward he gets some relief.  He has started falling recently.  MRI of the lumbar spine from July 2014 shows severe spinal stenosis at L23 with an AP diameter of the thecal sac of 5 mm, at L3-L4 there is severe spinal stenosis as well, and that L. 4/5 there is spondylolisthesis and severe spinal stenosis.  There is milder spinal stenosis at L1-L2.  Plain radiographs demonstrate multilevel spondylosis throughout the  lumbar spine.  There is a mobile spondylolisthesis of L4 on L5.  There's 5 mm of anterolisthesis on extension and 9.1 mm on flexion.     PAST MEDICAL/SURGICAL HISTORY  (Detailed)  Disease/disorder Onset Date Management Date Comments    Cataract extraction    Diabetes type 2      Hyperlipidemia      Hypertension      Stroke          PAST MEDICAL HISTORY, SURGICAL HISTORY, FAMILY HISTORY, SOCIAL HISTORY AND REVIEW OF SYSTEMS I have reviewed the patient's past medical, surgical, family and social history as well as the comprehensive review of systems as included on the [facility] history form dated 05/02/2013, which I have signed.  Family History  (Detailed) Patient reports there is no relevant family history.   SOCIAL HISTORY  (Detailed) Tobacco use reviewed. Preferred language is Unknown.   Smoking status: Never smoker.  SMOKING STATUS Use Status Type Smoking Status Usage Per Day Years Used Total Pack Years  no/never  Never smoker             Medications (added, continued or stopped this visit):   Medication Dose Prescribed Else Ind Started Stopped  allopurinol 300 mg tablet 300 mg Y    Aspir-81 81 mg tablet,delayed release 81 mg Y    clopidogrel 75  mg tablet 75 mg Y    gabapentin 600 mg tablet 600 mg Y    lisinopril 20 mg-hydrochlorothiazide 12.5 mg tablet 20 mg-12.5 mg Y    metformin ER 500 mg tablet,extended release 24 hr 500 mg Y    pravastatin 40 mg tablet 40 mg Y       Allergies:  Ingredient Reaction Medication Name Comment  PENICILLINS Rash    SULFA (SULFONAMIDE ANTIBIOTICS) Rash    New allergies added during this encounter. Active list above.  REVIEW OF SYSTEMS: System Neg/Pos Details  Constitutional Negative Chills, fatigue, fever, malaise, night sweats, weight gain and weight loss.  ENMT Negative Ear drainage, hearing loss, nasal drainage, otalgia, sinus pressure and sore throat.  Eyes Negative Eye discharge, eye  pain and vision changes.  Respiratory Negative Chronic cough, cough, dyspnea, known TB exposure and wheezing.  Cardio Positive Edema.  GI Negative Abdominal pain, blood in stool, change in stool pattern, constipation, decreased appetite, diarrhea, heartburn, nausea and vomiting.  GU Negative Dribbling, dysuria, erectile dysfunction, hematuria, polyuria, slow stream, urinary frequency, urinary incontinence and urinary retention.  Endocrine Negative Cold intolerance, heat intolerance, polydipsia and polyphagia.  Neuro Positive Extremity weakness, Numbness in extremities.  Psych Negative Anxiety, depression and insomnia.  Integumentary Negative Brittle hair, brittle nails, change in shape/size of mole(s), hair loss, hirsutism, hives, pruritus, rash and skin lesion.  MS Positive Back pain, BLE pain.  Hema/Lymph Negative Easy bleeding, easy bruising and lymphadenopathy.  Allergic/Immuno Negative Contact allergy, environmental allergies, food allergies and seasonal allergies.  Reproductive Negative Penile discharge and sexual dysfunction.    Vitals Date Temp F BP Pulse Ht In Wt Lb BMI BSA Pain Score  05/02/2013  121/77 80 70 235 33.72  5/10     PHYSICAL EXAM General Level of Distress: no acute distress Overall Appearance: normal    Cardiovascular Cardiac: regular rate and rhythm without murmur  Respiratory Lungs: clear to auscultation  Neurological Recent and Remote Memory: normal Attention Span and Concentration:   normal Language: normal Fund of Knowledge: normal  Right Left Sensation: normal normal Upper Extremity Coordination: normal normal  Lower Extremity Coordination: normal normal  Musculoskeletal Gait and Station: normal  Right Left Upper Extremity Muscle Strength: normal normal Lower Extremity Muscle Strength: normal normal Upper Extremity Muscle Tone:  normal normal Lower Extremity Muscle Tone: normal normal  Motor Strength Upper and lower extremity motor  strength was tested in the clinically pertinent muscles.   Deep Tendon Reflexes  Right Left Biceps: normal normal Triceps: normal normal Brachiloradialis: normal normal Patellar: normal normal Achilles: normal normal  Sensory Sensation was tested at L1 to S1.  Cranial Nerves II. Optic Nerve/Visual Fields: normal III. Oculomotor: normal IV. Trochlear: normal V. Trigeminal: normal VI. Abducens: normal VII. Facial: normal VIII. Acoustic/Vestibular: normal IX. Glossopharyngeal: normal X. Vagus: normal XI. Spinal Accessory: normal XII. Hypoglossal: normal  Motor and other Tests Lhermittes: negative Rhomberg: negative    Right Left Hoffman's: normal normal Clonus: normal normal Babinski: normal normal SLR: negative negative Patrick's Pearlean Brownie): negative negative Toe Walk: normal normal Toe Lift: normal normal Heel Walk: normal normal SI Joint: nontender nontender   Additional Findings:  Patient does have decreased sensation in both legs along with decreased vibratory sensation and proprioception with decreased pin sensation to the level of the mid calf bilaterally.  He walks with a flexed attitude.  He feels that both of his legs bother him as much as his low back.  He has limited mobility in forward bending.  DIAGNOSTIC RESULTS MRI of the lumbar spine from July 2014 shows severe spinal stenosis at L23 with an AP diameter of the thecal sac of 5 mm, at L3-L4 there is severe spinal stenosis as well, and that L. 4/5 there is spondylolisthesis and severe spinal stenosis.  There is milder spinal stenosis at L1-L2.  Plain radiographs demonstrate multilevel spondylosis throughout the lumbar spine.  There is a mobile spondylolisthesis of L4 on L5.  There's 5 mm of anterolisthesis on extension and 9.1 mm on flexion.    IMPRESSION Patient has severe spinal stenosis and spondylolisthesis with marked nerve root compression.  He has not had sustained relief with epidural  injections.  He is on Plavix and has a carotid occlusion.  Assessment/Plan # Detail Type Description   1. Assessment Lumbago (724.2).       2. Assessment Acquired spondylolisthesis (738.4).       3. Assessment Lumbar spinal stenosis (724.02).       4. Assessment Lumbar radiculopathy (724.4).       5. Assessment Lumbar spondylosis (721.3).        Patient needs to be cleared for surgery by Dr. Sharee Pimple and this will include cessation of Plavix around surgery.  Pending clearance, we will proceed with decompression and fusion L2-L5 levels.  Risks and benefits were discussed in detail with the patient who wishes to proceed.  He was fitted for an LSO brace and his questions were answered.  Greater than 60 minutes was spent in direct patient care and counseling.  Orders: Office Procedures/Services: Assessment Service Comments   Dr. Greggory Stallion to clear patient for surgery.  Patient will need to be off Plavix before surgical procedure can be performed.               Provider:  Danae Orleans. Venetia Maxon  05/07/2013 03:25 PM Dictation edited by: Danae Orleans. Venetia Maxon    CC Providers: [facility] [address], [place] [address]- ----------------------------------------------------------------------------------------------------------------------------------------------------------------------         Electronically signed by Danae Orleans Venetia Maxon on 05/07/2013 03:25 PM

## 2013-06-02 NOTE — Op Note (Signed)
06/02/2013  4:23 PM  PATIENT:  Caleb Barrett  77 y.o. male  PRE-OPERATIVE DIAGNOSIS:  Spondylolisthesis, Lumbar stenosis, Spondylosis, Radiculopathy L 23, L 34, L 45  POST-OPERATIVE DIAGNOSIS:  Spondylolisthesis, Lumbar stenosis, Spondylosis, Radiculopathy L 23, L 34, L 45  PROCEDURE:  Procedure(s): POSTERIOR LUMBAR FUSION 3 LEVEL,LUMBAR TWO-LUMBAR FIVE (N/A) Lumbar decompression L 2 - L 5 levels with pedicle screw fixation and posterolateral arthrodesis  SURGEON:  Surgeon(s) and Role:    * Gretta Samons, MD - Primary    * David S Jones, MD - Assisting  PHYSICIAN ASSISTANT:   ASSISTANTS: Poteat, RN   ANESTHESIA:   general  EBL:  Total I/O In: 2850 [I.V.:2500; Blood:100; IV Piggyback:250] Out: 900 [Urine:500; Blood:400]  BLOOD ADMINISTERED:none  DRAINS: (Medium) Hemovact drain(s) in the epidural space with  Suction Open   LOCAL MEDICATIONS USED:  LIDOCAINE   SPECIMEN:  No Specimen  DISPOSITION OF SPECIMEN:  N/A  COUNTS:  YES  TOURNIQUET:  * No tourniquets in log *  DICTATION: DICTATION: Patient is 77-year-old man with lumbar spondylolisthesis, severe stenosis, spondylosis and bilateral lower extremity radiculopathies.  Patient has severe spinal stenosis and a long history of severe back and bilateral leg pain. It was elected to take him to surgery for decompression and fusion from L2/3 through L 4/5 levels.   Procedure: Patient was placed in a prone position on the Jackson table after smooth and uncomplicated induction of general endotracheal anesthesia. His low back was prepped and draped in usual sterile fashion with betadine scrub and DuraPrep. Area of incision was infiltrated with local lidocaine. Incision was made to the lumbodorsal fascia was incised and exposure was performed of the L 2- L 5 spinous processes laminae facet joint and transverse processes. Intraoperative x-ray was obtained which confirmed correct orientation with marker probes at L2 through L 5 levels.  A total laminectomy of L2 through L5 levels was performed with disarticulation of the facet joints and thorough decompression was performed of both L2, L3, L4, L5 nerve roots along with the common dural tube. Decompression included facetectomies at each level.  It was elected not to perform PLIF. The posterolateral region was extensively decorticated and pedicle probes were placed at L2 through L 5  bilaterally. Intraoperative fluoroscopy confirmed correct orientationin the AP and lateral plane. 45 x 6.5 mm pedicle screws were placed at L 5 bilaterally and 45 x 6.5 mm screws placed at L 2, L 3, L 4,  Bilaterally. Final x-rays demonstrated well-positioned pedicle screw fixation. A 90 mm lordotic rod was placed on the right and a 90 mm rod was placed on the left locked down in situ and the posterolateral region was packed with small BMP and 20 cc of autograft bilaterally. The wound was irrigated.  A medium Hemovac drain was placed.  Fascia was closed with 1 Vicryl sutures skin edges were reapproximated 2 and 3-0 Vicryl sutures. The wound was dressed with Dermabond and an occlusive dressing.  The patient was extubated in the operating room and taken to recovery in stable satisfactory condition.  He tolerated the operation well.  Counts were correct at the end of the case.   PLAN OF CARE: Admit to inpatient   PATIENT DISPOSITION:  PACU - hemodynamically stable.   Delay start of Pharmacological VTE agent (>24hrs) due to surgical blood loss or risk of bleeding: yes  

## 2013-06-02 NOTE — Anesthesia Preprocedure Evaluation (Addendum)
Anesthesia Evaluation  Patient identified by MRN, date of birth, ID band Patient awake    Reviewed: Allergy & Precautions, H&P , NPO status , Patient's Chart, lab work & pertinent test results  History of Anesthesia Complications (+) history of anesthetic complications (nauseated after shoulder surgery)  Airway Mallampati: II TM Distance: >3 FB Neck ROM: Full    Dental  (+) Poor Dentition, Dental Advisory Given and Missing   Pulmonary sleep apnea (no longer requires CPAP) , former smoker,  breath sounds clear to auscultation  Pulmonary exam normal       Cardiovascular hypertension, - angina+ Peripheral Vascular Disease (carotid stenosis, occluded L ICA) Rhythm:Regular Rate:Normal     Neuro/Psych TIA (on plavix now)   GI/Hepatic negative GI ROS, Neg liver ROS,   Endo/Other  diabetes (glu 97), Oral Hypoglycemic AgentsMorbid obesity  Renal/GU negative Renal ROS     Musculoskeletal   Abdominal (+) + obese,   Peds  Hematology   Anesthesia Other Findings   Reproductive/Obstetrics                          Anesthesia Physical Anesthesia Plan  ASA: III  Anesthesia Plan: General   Post-op Pain Management:    Induction: Intravenous  Airway Management Planned: Oral ETT  Additional Equipment:   Intra-op Plan:   Post-operative Plan: Extubation in OR  Informed Consent: I have reviewed the patients History and Physical, chart, labs and discussed the procedure including the risks, benefits and alternatives for the proposed anesthesia with the patient or authorized representative who has indicated his/her understanding and acceptance.   Dental advisory given  Plan Discussed with: CRNA and Surgeon  Anesthesia Plan Comments: (Plan routine monitors, GETA)        Anesthesia Quick Evaluation

## 2013-06-02 NOTE — Progress Notes (Signed)
Awake, alert, MAEW.  Doing well.  

## 2013-06-02 NOTE — Anesthesia Procedure Notes (Signed)
Procedure Name: Intubation Date/Time: 06/02/2013 12:26 PM Performed by: Sherie Don Pre-anesthesia Checklist: Patient identified, Emergency Drugs available, Suction available, Patient being monitored and Timeout performed Patient Re-evaluated:Patient Re-evaluated prior to inductionOxygen Delivery Method: Circle system utilized Intubation Type: IV induction Ventilation: Mask ventilation without difficulty, Two handed mask ventilation required and Oral airway inserted - appropriate to patient size Laryngoscope Size: Mac and 4 Grade View: Grade III Tube size: 8.0 mm Number of attempts: 1 Airway Equipment and Method: Stylet Placement Confirmation: positive ETCO2,  ETT inserted through vocal cords under direct vision and breath sounds checked- equal and bilateral Secured at: 23 cm Tube secured with: Tape Dental Injury: Teeth and Oropharynx as per pre-operative assessment

## 2013-06-02 NOTE — Progress Notes (Signed)
Pharmacy Consult: Vancomycin x 2 doses post op. 78 yo M s/p posterior lumbar fusion.  He was given vancomycin 1500 mg preop at 1210 today.  Wt 107.2 kg.  Creat 1.12, creat cl > 60 ml/min.  Pt with a hemovac drain in epidural space.  Plan: vancomycin 1000 mg IV q12 hrs x 2 doses Herby Abraham, Pharm.D. [phone] 06/02/2013 4:38 PM

## 2013-06-02 NOTE — Transfer of Care (Signed)
Immediate Anesthesia Transfer of Care Note  Patient: Caleb Barrett  Procedure(s) Performed: Procedure(s): POSTERIOR LUMBAR FUSION 3 LEVEL,LUMBAR TWO-LUMBAR FIVE (N/A)  Patient Location: PACU  Anesthesia Type:General  Level of Consciousness: awake, sedated, patient cooperative and confused  Airway & Oxygen Therapy: Patient Spontanous Breathing and Patient connected to face mask oxygen  Post-op Assessment: Report given to PACU RN, Post -op Vital signs reviewed and stable and Patient moving all extremities  Post vital signs: Reviewed and stable  Complications: No apparent anesthesia complications

## 2013-06-02 NOTE — Interval H&P Note (Signed)
History and Physical Interval Note:  06/02/2013 7:23 AM  Caleb Barrett  has presented today for surgery, with the diagnosis of Spondylolisthesis, Lumbar stenosis, Spondylosis, Radiculopathy  The various methods of treatment have been discussed with the patient and family. After consideration of risks, benefits and other options for treatment, the patient has consented to  Procedure(s) with comments: POSTERIOR LUMBAR FUSION 3 LEVEL (N/A) - L2 to L5 Decompression/Posterior lumbar interbody fusion as a surgical intervention .  The patient's history has been reviewed, patient examined, no change in status, stable for surgery.  I have reviewed the patient's chart and labs.  Questions were answered to the patient's satisfaction.     Roise Emert D

## 2013-06-02 NOTE — Anesthesia Postprocedure Evaluation (Signed)
  Anesthesia Post-op Note  Patient: Caleb Barrett  Procedure(s) Performed: Procedure(s): POSTERIOR LUMBAR FUSION 3 LEVEL,LUMBAR TWO-LUMBAR FIVE (N/A)  Patient Location: PACU  Anesthesia Type:General  Level of Consciousness: awake, alert , oriented and patient cooperative  Airway and Oxygen Therapy: Patient Spontanous Breathing and Patient connected to nasal cannula oxygen  Post-op Pain: none  Post-op Assessment: Post-op Vital signs reviewed, Patient's Cardiovascular Status Stable, Respiratory Function Stable, Patent Airway, No signs of Nausea or vomiting and Pain level controlled  Post-op Vital Signs: Reviewed and stable  Complications: No apparent anesthesia complications

## 2013-06-02 NOTE — Preoperative (Signed)
Beta Blockers   Reason not to administer Beta Blockers:Not Applicable 

## 2013-06-02 NOTE — Brief Op Note (Signed)
06/02/2013  4:23 PM  PATIENT:  Caleb Barrett  78 y.o. male  PRE-OPERATIVE DIAGNOSIS:  Spondylolisthesis, Lumbar stenosis, Spondylosis, Radiculopathy L 23, L 34, L 45  POST-OPERATIVE DIAGNOSIS:  Spondylolisthesis, Lumbar stenosis, Spondylosis, Radiculopathy L 23, L 34, L 45  PROCEDURE:  Procedure(s): POSTERIOR LUMBAR FUSION 3 LEVEL,LUMBAR TWO-LUMBAR FIVE (N/A) Lumbar decompression L 2 - L 5 levels with pedicle screw fixation and posterolateral arthrodesis  SURGEON:  Surgeon(s) and Role:    * Maeola Harman, MD - Primary    * Tia Alert, MD - Assisting  PHYSICIAN ASSISTANT:   ASSISTANTS: Poteat, RN   ANESTHESIA:   general  EBL:  Total I/O In: 2850 [I.V.:2500; Blood:100; IV Piggyback:250] Out: 900 [Urine:500; Blood:400]  BLOOD ADMINISTERED:none  DRAINS: (Medium) Hemovact drain(s) in the epidural space with  Suction Open   LOCAL MEDICATIONS USED:  LIDOCAINE   SPECIMEN:  No Specimen  DISPOSITION OF SPECIMEN:  N/A  COUNTS:  YES  TOURNIQUET:  * No tourniquets in log *  DICTATION: DICTATION: Patient is 78 year old man with lumbar spondylolisthesis, severe stenosis, spondylosis and bilateral lower extremity radiculopathies.  Patient has severe spinal stenosis and a long history of severe back and bilateral leg pain. It was elected to take him to surgery for decompression and fusion from L2/3 through L 4/5 levels.   Procedure: Patient was placed in a prone position on the [place] table after smooth and uncomplicated induction of general endotracheal anesthesia. His low back was prepped and draped in usual sterile fashion with betadine scrub and DuraPrep. Area of incision was infiltrated with local lidocaine. Incision was made to the lumbodorsal fascia was incised and exposure was performed of the L 2- L 5 spinous processes laminae facet joint and transverse processes. Intraoperative x-ray was obtained which confirmed correct orientation with marker probes at L2 through L 5 levels.  A total laminectomy of L2 through L5 levels was performed with disarticulation of the facet joints and thorough decompression was performed of both L2, L3, L4, L5 nerve roots along with the common dural tube. Decompression included facetectomies at each level.  It was elected not to perform PLIF. The posterolateral region was extensively decorticated and pedicle probes were placed at L2 through L 5  bilaterally. Intraoperative fluoroscopy confirmed correct orientationin the AP and lateral plane. 45 x 6.5 mm pedicle screws were placed at L 5 bilaterally and 45 x 6.5 mm screws placed at L 2, L 3, L 4,  Bilaterally. Final x-rays demonstrated well-positioned pedicle screw fixation. A 90 mm lordotic rod was placed on the right and a 90 mm rod was placed on the left locked down in situ and the posterolateral region was packed with small BMP and 20 cc of autograft bilaterally. The wound was irrigated.  A medium Hemovac drain was placed.  Fascia was closed with 1 Vicryl sutures skin edges were reapproximated 2 and 3-0 Vicryl sutures. The wound was dressed with Dermabond and an occlusive dressing.  The patient was extubated in the operating room and taken to recovery in stable satisfactory condition.  He tolerated the operation well.  Counts were correct at the end of the case.   PLAN OF CARE: Admit to inpatient   PATIENT DISPOSITION:  PACU - hemodynamically stable.   Delay start of Pharmacological VTE agent (>24hrs) due to surgical blood loss or risk of bleeding: yes

## 2013-06-03 LAB — GLUCOSE, CAPILLARY
Glucose-Capillary: 128 mg/dL — ABNORMAL HIGH (ref 70–99)
Glucose-Capillary: 170 mg/dL — ABNORMAL HIGH (ref 70–99)

## 2013-06-03 LAB — POCT I-STAT 4, (NA,K, GLUC, HGB,HCT)
Glucose, Bld: 117 mg/dL — ABNORMAL HIGH (ref 70–99)
HCT: 38 % — ABNORMAL LOW (ref 39.0–52.0)
Hemoglobin: 12.9 g/dL — ABNORMAL LOW (ref 13.0–17.0)
Potassium: 4.2 mEq/L (ref 3.5–5.1)

## 2013-06-03 MED ORDER — PANTOPRAZOLE SODIUM 40 MG PO TBEC
40.0000 mg | DELAYED_RELEASE_TABLET | Freq: Every day | ORAL | Status: DC
  Administered 2013-06-03 – 2013-06-06 (×4): 40 mg via ORAL
  Filled 2013-06-03 (×4): qty 1

## 2013-06-03 MED ORDER — SODIUM CHLORIDE 0.9 % IV SOLN
Freq: Once | INTRAVENOUS | Status: AC
  Administered 2013-06-03: 20:00:00 via INTRAVENOUS

## 2013-06-03 NOTE — Progress Notes (Signed)
Patient ID: Caleb Barrett, male   DOB: 10-20-1934, 78 y.o.   MRN: [id number] Subjective: Patient reports back soreness, some pain in quads  Objective: Vital signs in last 24 hours: Temp:  [97.2 F (36.2 C)-98.2 F (36.8 C)] 97.7 F (36.5 C) (10/24 4696) Pulse Rate:  [72-87] 72 (10/24 0613) Resp:  [12-20] 20 (10/24 0613) BP: (95-138)/(47-70) 120/61 mmHg (10/24 0613) SpO2:  [95 %-100 %] 96 % (10/24 2952)  Intake/Output from previous day: 10/23 0701 - 10/24 0700 In: 2850 [I.V.:2500; Blood:100; IV Piggyback:250] Out: 2465 [Urine:1700; Drains:365; Blood:400] Intake/Output this shift: Total I/O In: 360 [P.O.:360] Out: -   Neurologic: Grossly normal  Lab Results: Lab Results  Component Value Date   WBC 8.1 05/26/2013   HGB 12.9* 06/02/2013   HCT 38.0* 06/02/2013   MCV 86.8 05/26/2013   PLT 161 05/26/2013   No results found for this basename: INR, PROTIME   BMET Lab Results  Component Value Date   NA 140 06/02/2013   K 4.2 06/02/2013   CL 98 05/26/2013   CO2 27 05/26/2013   GLUCOSE 117* 06/02/2013   BUN 20 05/26/2013   CREATININE 1.12 05/26/2013   CALCIUM 10.3 05/26/2013    Studies/Results: Dg Lumbar Spine 2-3 Views  06/02/2013   CLINICAL DATA:  Lumbar fusion.  EXAM: LUMBAR SPINE - 2-3 VIEW; DG C-ARM 1-60 MIN  COMPARISON:  Lumbar spine radiographs 05/02/2013  FINDINGS: There are pedicle screws in the L2, L3, L4 and L5 vertebral bodies. Good position without complicating features.  IMPRESSION: Pedicle screws in good position without complicating features.   Electronically Signed   By: Loralie Champagne M.D.   On: 06/02/2013 17:27   Dg Lumbar Spine 2-3 Views  06/02/2013   CLINICAL DATA:  Lateral lumbar spine image for old surgical localization  EXAM: LUMBAR SPINE - 2-3 VIEW  COMPARISON:  05/02/2013  FINDINGS: Three surgical probes have been inserted through skin retractors, the more superior lying posterior to the mid body of L3, the middle lying posterior to the mid body  of L4 at the mid level of the pedicle, and the most inferior lying posterior to the lower L5 vertebrae superimposed over the L5 pars interarticularis. There is a grade 1 anterolisthesis of L4 on L5 where there is also moderate loss of disk height. This is stable.  IMPRESSION: Lateral lumbar spine image for surgical localization as detailed.   Electronically Signed   By: Amie Portland M.D.   On: 06/02/2013 14:13   Dg C-arm 1-60 Min  06/02/2013   CLINICAL DATA:  Lumbar fusion.  EXAM: LUMBAR SPINE - 2-3 VIEW; DG C-ARM 1-60 MIN  COMPARISON:  Lumbar spine radiographs 05/02/2013  FINDINGS: There are pedicle screws in the L2, L3, L4 and L5 vertebral bodies. Good position without complicating features.  IMPRESSION: Pedicle screws in good position without complicating features.   Electronically Signed   By: Loralie Champagne M.D.   On: 06/02/2013 17:27    Assessment/Plan: Mobilize today with PT/OT, pain control   LOS: 1 day    Tiea Manninen S 06/03/2013, 9:52 AM

## 2013-06-03 NOTE — Evaluation (Signed)
Occupational Therapy Evaluation Patient Details Name: Caleb Barrett MRN: [id number] DOB: 05/11/1935 Today's Date: 06/03/2013 Time: 4098-1191 OT Time Calculation (min): 28 min  OT Assessment / Plan / Recommendation History of present illness 78 yo M s/p posterior lumbar fusion.   Clinical Impression   Pt demos decline in function and safety with ADLs and ADL mobility and would benefit from acute OT services to address impairments to help restore PLOF to return home safely    OT Assessment  Patient needs continued OT Services    Follow Up Recommendations  Home health OT;Supervision/Assistance - 24 hour    Barriers to Discharge  None  Equipment Recommendations  3 in 1 bedside comode;Tub/shower seat;Tub/shower bench;None recommended by OT (A/E)    Recommendations for Other Services    Frequency  Min 2X/week    Precautions / Restrictions Precautions Precautions: Back Precaution Booklet Issued: Yes (comment) Precaution Comments: pt able to recall 2/3 back precautions, reviewed all back precautions with pt Required Braces or Orthoses: Spinal Brace Spinal Brace: Lumbar corset Restrictions Weight Bearing Restrictions: No   Pertinent Vitals/Pain 5/10 back    ADL  Grooming: Performed;Wash/dry hands;Wash/dry face;Min guard;Teeth care Where Assessed - Grooming: Supported standing Upper Body Bathing: Simulated;Supervision/safety;Set up Lower Body Bathing: Simulated;Moderate assistance Upper Body Dressing: Performed;Supervision/safety;Set up;Other (comment) (min A to donn back brace) Lower Body Dressing: Performed;Maximal assistance Toilet Transfer: Performed;Min guard Toilet Transfer Method: Sit to Barista: Grab bars;Raised toilet seat with arms (or 3-in-1 over toilet) Toileting - Clothing Manipulation and Hygiene: Minimal assistance Where Assessed - Engineer, mining and Hygiene: Standing Tub/Shower Transfer Method: Not assessed Equipment  Used: Back brace;Long-handled shoe horn;Long-handled sponge;Reacher;Gait belt;Rolling walker;Sock aid Transfers/Ambulation Related to ADLs: VCs for hand placement and safety ADL Comments: pt provided with education and demo of ADL A/E, education and pictures of toilet aid, tbu bench, toilet riser and shower chair    OT Diagnosis: Acute pain  OT Problem List: Decreased knowledge of use of DME or AE;Decreased activity tolerance;Impaired balance (sitting and/or standing);Pain OT Treatment Interventions: Self-care/ADL training;Therapeutic exercise;Patient/family education;Neuromuscular education;Balance training;Therapeutic activities;DME and/or AE instruction   OT Goals(Current goals can be found in the care plan section) Acute Rehab OT Goals Patient Stated Goal: to go home OT Goal Formulation: With patient Time For Goal Achievement: 06/10/13 Potential to Achieve Goals: Good  Visit Information  Last OT Received On: 06/03/13 Assistance Needed: +1 History of Present Illness: 78 yo M s/p posterior lumbar fusion.       Prior Functioning     Home Living Family/patient expects to be discharged to:: Private residence Living Arrangements: Spouse/significant other Available Help at Discharge: Family Type of Home: House Home Access: Stairs to enter Secretary/administrator of Steps: 5 Entrance Stairs-Rails: Right;Left Home Layout: Two level;Bed/bath upstairs Alternate Level Stairs-Number of Steps: 12 Alternate Level Stairs-Rails: Can reach both Home Equipment: None Prior Function Level of Independence: Independent Communication Communication: No difficulties Dominant Hand: Right         Vision/Perception Vision - History Baseline Vision: Wears glasses all the time Patient Visual Report: No change from baseline Perception Perception: Within Functional Limits   Cognition  Cognition Arousal/Alertness: Awake/alert Behavior During Therapy: WFL for tasks  assessed/performed Overall Cognitive Status: Within Functional Limits for tasks assessed    Extremity/Trunk Assessment Upper Extremity Assessment Upper Extremity Assessment: Overall WFL for tasks assessed Lower Extremity Assessment Lower Extremity Assessment: Defer to PT evaluation Cervical / Trunk Assessment Cervical / Trunk Assessment: Normal     Mobility Bed Mobility Bed  Mobility: Not assessed Rolling Right: 5: Supervision Right Sidelying to Sit: 4: Min assist Sitting - Scoot to Edge of Bed: 5: Supervision Details for Bed Mobility Assistance: VCs for technique with log roll and sequencing Transfers Transfers: Sit to Stand;Stand to Sit Sit to Stand: 4: Min guard;From chair/3-in-1;From toilet Stand to Sit: 4: Min guard;To chair/3-in-1;To toilet Details for Transfer Assistance: VCs for hand placement and safety     Exercise     Balance Balance Balance Assessed: Yes Dynamic Standing Balance Dynamic Standing - Balance Support: No upper extremity supported;During functional activity Dynamic Standing - Level of Assistance: 5: Stand by assistance;Other (comment) (min guard A)    End of Session OT - End of Session Equipment Utilized During Treatment: Gait belt;Rolling walker;Back brace;Other (comment) (3 in 1, ADL A/E) Activity Tolerance: Patient tolerated treatment well Patient left: in chair;with call bell/phone within reach  GO     Galen Manila 06/03/2013, 12:12 PM

## 2013-06-03 NOTE — Progress Notes (Signed)
Patient blood pressure of 80/42. He is alert and oriented, denied any pain or any dizziness at this time. Patient currently with IVF of d51/2NS with 20 meq K+ at 84ml/hr. On call MD paged. Spoke with [employer] operational officer. Awaiting on call back. Oncoming nurse aware.   Sim Boast, RN 06/03/13 1912

## 2013-06-03 NOTE — Progress Notes (Signed)
Dr. Conchita Paris called back with order. Will continue to monitor.   Sim Boast, RN 06/03/13 1930

## 2013-06-03 NOTE — Evaluation (Signed)
Physical Therapy Evaluation Patient Details Name: Eleanor Dimichele MRN: [id number] DOB: 1935-02-23 Today's Date: 06/03/2013 Time: 1478-2956 PT Time Calculation (min): 15 min  PT Assessment / Plan / Recommendation History of Present Illness  78 yo M s/p posterior lumbar fusion.  Clinical Impression  Patient demonstrates deficits in functional mobility as indicated below. Pt will benefit from continued skilled PT to address deficits and maximize function. Will continue to see as indicated, recommend HHPT and assist upon discharge.     PT Assessment  Patient needs continued PT services    Follow Up Recommendations  Home health PT;Supervision/Assistance - 24 hour    Does the patient have the potential to tolerate intense rehabilitation      Barriers to Discharge Inaccessible home environment bed and bath upstairs, will need to perform stair negotation    Equipment Recommendations  Rolling walker with 5" wheels;3in1 (PT)    Recommendations for Other Services     Frequency Min 5X/week    Precautions / Restrictions Precautions Precautions: Back Precaution Booklet Issued: Yes (comment) Precaution Comments: handout provided Required Braces or Orthoses: Spinal Brace Spinal Brace: Lumbar corset Restrictions Weight Bearing Restrictions: No   Pertinent Vitals/Pain 4/10      Mobility  Bed Mobility Bed Mobility: Rolling Right;Right Sidelying to Sit;Sitting - Scoot to Edge of Bed Rolling Right: 5: Supervision Right Sidelying to Sit: 4: Min assist Sitting - Scoot to Edge of Bed: 5: Supervision Details for Bed Mobility Assistance: VCs for technique with log roll and sequencing Transfers Transfers: Sit to Stand;Stand to Sit Sit to Stand: 5: Supervision Stand to Sit: 5: Supervision Details for Transfer Assistance: VCs for hand placement and safety Ambulation/Gait Ambulation/Gait Assistance: 5: Supervision;4: Min guard Ambulation Distance (Feet): 90 Feet Assistive device: Rolling  walker Ambulation/Gait Assistance Details: VCs for upright posture Gait Pattern: Step-through pattern;Decreased stride length;Trunk flexed Gait velocity: decreased General Gait Details: patient with significant fwd flexed posture despite cues and brace, Will continue to encourage upright Stairs: No    Exercises     PT Diagnosis: Difficulty walking;Abnormality of gait;Acute pain  PT Problem List: Decreased strength;Decreased range of motion;Decreased activity tolerance;Decreased balance;Decreased mobility;Decreased knowledge of use of DME PT Treatment Interventions: DME instruction;Gait training;Stair training;Functional mobility training;Therapeutic activities;Therapeutic exercise;Patient/family education     PT Goals(Current goals can be found in the care plan section) Acute Rehab PT Goals Patient Stated Goal: to go home PT Goal Formulation: With patient Time For Goal Achievement: 06/17/13 Potential to Achieve Goals: Good  Visit Information  Last PT Received On: 06/03/13 Assistance Needed: +1 History of Present Illness: 78 yo M s/p posterior lumbar fusion.       Prior Functioning  Home Living Family/patient expects to be discharged to:: Private residence Living Arrangements: Spouse/significant other Type of Home: House Home Access: Stairs to enter Secretary/administrator of Steps: 5 Entrance Stairs-Rails: Right;Left Home Layout: Two level;Bed/bath upstairs Alternate Level Stairs-Number of Steps: 12 Alternate Level Stairs-Rails: Can reach both Home Equipment: None Prior Function Level of Independence: Independent Communication Communication:  (wears glasses all the time) Dominant Hand: Right    Cognition  Cognition Arousal/Alertness: Awake/alert Behavior During Therapy: WFL for tasks assessed/performed Overall Cognitive Status: Within Functional Limits for tasks assessed    Extremity/Trunk Assessment Upper Extremity Assessment Upper Extremity Assessment: Defer to  OT evaluation Lower Extremity Assessment Lower Extremity Assessment: Overall WFL for tasks assessed   Balance Balance Balance Assessed: Yes Static Sitting Balance Static Sitting - Balance Support: Feet supported Static Sitting - Level of Assistance: 7: Independent  High Level Balance High Level Balance Activites: Side stepping;Direction changes;Turns High Level Balance Comments: VCs for turns without twisting  End of Session PT - End of Session Equipment Utilized During Treatment: Gait belt Activity Tolerance: Patient tolerated treatment well Patient left: Other (comment) (With OT) Nurse Communication: Mobility status  GP     Fabio Asa 06/03/2013, 11:15 AM Charlotte Crumb, PT DPT  [phone]

## 2013-06-04 ENCOUNTER — Encounter (HOSPITAL_COMMUNITY): Payer: Self-pay | Admitting: General Practice

## 2013-06-04 LAB — GLUCOSE, CAPILLARY: Glucose-Capillary: 135 mg/dL — ABNORMAL HIGH (ref 70–99)

## 2013-06-04 NOTE — Progress Notes (Signed)
Patient ID: Caleb Barrett, male   DOB: [birth year], 78 y.o.   MRN: [id number] Subjective:  the patient is alert and pleasant. He has had some urinary retention.  Objective: Vital signs in last 24 hours: Temp:  [97.1 F (36.2 C)-98.8 F (37.1 C)] 98.5 F (36.9 C) (10/25 0612) Pulse Rate:  [77-89] 83 (10/25 0612) Resp:  [18-20] 18 (10/25 0612) BP: (80-129)/(35-66) 90/49 mmHg (10/25 0612) SpO2:  [93 %-100 %] 100 % (10/25 0612) Weight:  [114.851 kg (253 lb 3.2 oz)] 114.851 kg (253 lb 3.2 oz) (10/25 0821)  Intake/Output from previous day: 10/24 0701 - 10/25 0700 In: 2628.8 [P.O.:840; I.V.:1588.8; IV Piggyback:200] Out: 1125 [Urine:800; Drains:325] Intake/Output this shift:    Physical exam the patient is alert and oriented. He is moving all 4 extremities well.  Lab Results:  Recent Labs  06/02/13 1519  HGB 12.9*  HCT 38.0*   BMET  Recent Labs  06/02/13 1519  NA 140  K 4.2  GLUCOSE 117*    Studies/Results: Dg Lumbar Spine 2-3 Views  06/02/2013   CLINICAL DATA:  Lumbar fusion.  EXAM: LUMBAR SPINE - 2-3 VIEW; DG C-ARM 1-60 MIN  COMPARISON:  Lumbar spine radiographs 05/02/2013  FINDINGS: There are pedicle screws in the L2, L3, L4 and L5 vertebral bodies. Good position without complicating features.  IMPRESSION: Pedicle screws in good position without complicating features.   Electronically Signed   By: Loralie Champagne M.D.   On: 06/02/2013 17:27   Dg Lumbar Spine 2-3 Views  06/02/2013   CLINICAL DATA:  Lateral lumbar spine image for old surgical localization  EXAM: LUMBAR SPINE - 2-3 VIEW  COMPARISON:  05/02/2013  FINDINGS: Three surgical probes have been inserted through skin retractors, the more superior lying posterior to the mid body of L3, the middle lying posterior to the mid body of L4 at the mid level of the pedicle, and the most inferior lying posterior to the lower L5 vertebrae superimposed over the L5 pars interarticularis. There is a grade 1 anterolisthesis of L4  on L5 where there is also moderate loss of disk height. This is stable.  IMPRESSION: Lateral lumbar spine image for surgical localization as detailed.   Electronically Signed   By: Amie Portland M.D.   On: 06/02/2013 14:13   Dg C-arm 1-60 Min  06/02/2013   CLINICAL DATA:  Lumbar fusion.  EXAM: LUMBAR SPINE - 2-3 VIEW; DG C-ARM 1-60 MIN  COMPARISON:  Lumbar spine radiographs 05/02/2013  FINDINGS: There are pedicle screws in the L2, L3, L4 and L5 vertebral bodies. Good position without complicating features.  IMPRESSION: Pedicle screws in good position without complicating features.   Electronically Signed   By: Loralie Champagne M.D.   On: 06/02/2013 17:27    Assessment/Plan: Postop day #2: We will discontinue the Hemovac drain and continue to mobilize him.  Urinary retention: If he needs to be catheterized again, we will plan to keep the Foley in.   LOS: 2 days     Caleb Barrett D 06/04/2013, 9:07 AM

## 2013-06-04 NOTE — Progress Notes (Signed)
Physical Therapy Treatment Patient Details Name: Caleb Barrett MRN: [id number] DOB: [birth year] Today's Date: 06/04/2013 Time: 8657-8469 PT Time Calculation (min): 20 min  PT Assessment / Plan / Recommendation  History of Present Illness 78 yo M s/p posterior lumbar fusion.   PT Comments   Patient demonstrates improvements in activity tolerance and overall ambulation today.  Will attempt stair negotiation next session.   Follow Up Recommendations  Home health PT;Supervision/Assistance - 24 hour     Does the patient have the potential to tolerate intense rehabilitation     Barriers to Discharge        Equipment Recommendations  Rolling walker with 5" wheels;3in1 (PT)    Recommendations for Other Services    Frequency Min 5X/week   Progress towards PT Goals Progress towards PT goals: Progressing toward goals  Plan Current plan remains appropriate    Precautions / Restrictions Precautions Precautions: Back Precaution Comments: Reviewed precautions with patient.  Required Braces or Orthoses: Spinal Brace Spinal Brace: Lumbar corset;Applied in sitting position Restrictions Weight Bearing Restrictions: No   Pertinent Vitals/Pain 5/10    Mobility  Bed Mobility Bed Mobility: Rolling Right;Right Sidelying to Sit;Sitting - Scoot to Edge of Bed Rolling Right: 4: Min guard Right Sidelying to Sit: 4: Min assist Sitting - Scoot to Edge of Bed: 5: Supervision Details for Bed Mobility Assistance: cues for precautions and technique. Transfers Transfers: Sit to Stand;Stand to Sit Sit to Stand: 4: Min guard;From bed;From chair/3-in-1;With upper extremity assist Stand to Sit: 4: Min guard;With upper extremity assist;To chair/3-in-1 Details for Transfer Assistance: cues for hand placement and technique. Ambulation/Gait Ambulation/Gait Assistance: 5: Supervision;4: Min guard Ambulation Distance (Feet): 200 Feet Assistive device: Rolling walker Ambulation/Gait Assistance Details:  Continues to require VCs for upright posture  Gait Pattern: Step-through pattern;Decreased stride length;Trunk flexed Gait velocity: decreased General Gait Details: Overall good stability with ambulation, one seated rest break upon return to room on EOB prior to ambulating over to chair Stairs: No      PT Goals (current goals can now be found in the care plan section) Acute Rehab PT Goals Patient Stated Goal: not stated PT Goal Formulation: With patient Time For Goal Achievement: 06/17/13 Potential to Achieve Goals: Good  Visit Information  Last PT Received On: 06/04/13 Assistance Needed: +1 History of Present Illness: 78 yo M s/p posterior lumbar fusion.    Subjective Data  Patient Stated Goal: not stated   Cognition  Cognition Arousal/Alertness: Awake/alert Behavior During Therapy: WFL for tasks assessed/performed Overall Cognitive Status: Within Functional Limits for tasks assessed    Balance  Balance Balance Assessed: Yes Static Sitting Balance Static Sitting - Balance Support: Feet supported Static Sitting - Level of Assistance: 7: Independent Dynamic Standing Balance Dynamic Standing - Balance Support: No upper extremity supported;During functional activity Dynamic Standing - Level of Assistance: 5: Stand by assistance;Other (comment) (min guard A)  End of Session PT - End of Session Equipment Utilized During Treatment: Gait belt Activity Tolerance: Patient tolerated treatment well Patient left: in chair;with call bell/phone within reach Nurse Communication: Mobility status   GP     Fabio Asa 06/04/2013, 10:54 AM Charlotte Crumb, PT DPT  [phone]

## 2013-06-04 NOTE — Progress Notes (Signed)
Pt c/o urinary retention, bladder scanned at 0651, I&O, 700 cc out.  Will monitor urinary output.

## 2013-06-04 NOTE — Progress Notes (Signed)
Pt bladder scanned, 264 cc retained. Per MD order, Foley reinserted.  300 cc clear yellow urine returned.

## 2013-06-04 NOTE — Progress Notes (Addendum)
Occupational Therapy Treatment Patient Details Name: Tallon Gertz MRN: [id number] DOB: 11/28/1934 Today's Date: 06/04/2013 Time: 1478-2956 OT Time Calculation (min): 30 min  OT Assessment / Plan / Recommendation  History of present illness 78 yo M s/p posterior lumbar fusion.   OT comments  Practiced with AE for LB ADLs, performed toileting tasks, grooming at sink, and OT educated about tub transfer options and DME.   Follow Up Recommendations  Home health OT;Supervision/Assistance - 24 hour    Barriers to Discharge       Equipment Recommendations  3 in 1 bedside comode;Other (comment);Toilet rise with handles (AE kit-reacher, sockaid, long handled sponge and shoehorn) -Pt planning to use own chair he has at home for the tub and is unsure of 3 in 1 vs. Toilet riser.    Recommendations for Other Services    Frequency Min 2X/week   Progress towards OT Goals Progress towards OT goals: Progressing toward goals  Plan Discharge plan remains appropriate    Precautions / Restrictions Precautions Precautions: Back Precaution Comments: Reviewed precautions with patient.  Required Braces or Orthoses: Spinal Brace Spinal Brace: Lumbar corset;Applied in sitting position Restrictions Weight Bearing Restrictions: No   Pertinent Vitals/Pain Pain 5/10 in back. Increased activity during session.     ADL  Grooming: Performed;Teeth care;Set up;Supervision/safety Where Assessed - Grooming: Supported standing Upper Body Dressing: Minimal assistance (back brace) Where Assessed - Upper Body Dressing: Unsupported sitting Lower Body Dressing: Performed;Minimal assistance Where Assessed - Lower Body Dressing: Supported sit to stand Toilet Transfer: Min Pension scheme manager Method: Sit to stand (sit to stand from bed/chair; stood at toilet to urinate) Acupuncturist: Comfort height toilet;Other (comment) (sit to stand from bed/recliner chair; stood at toilet) Toileting - Designer, fashion/clothing and Hygiene: Minimal assistance Where Assessed - Glass blower/designer Manipulation and Hygiene: Standing Equipment Used: Back brace;Gait belt;Rolling walker;Reacher;Long-handled sponge;Sock aid Transfers/Ambulation Related to ADLs: Min guard-cues to stand up straight. Min guard for transfers. ADL Comments: OT educated on tub transfer techniques. Pt practiced with AE for LB ADLs-donned underwear and donned/doffed sock. Cues for precautions during session. OT educated on use of bag on walker to carry items and to have wife pick up rugs.     OT Diagnosis:    OT Problem List:   OT Treatment Interventions:     OT Goals(current goals can now be found in the care plan section) Acute Rehab OT Goals Patient Stated Goal: not stated OT Goal Formulation: With patient Time For Goal Achievement: 06/10/13 Potential to Achieve Goals: Good ADL Goals Pt Will Perform Grooming: with modified independence;standing Pt Will Perform Lower Body Bathing: with set-up;with adaptive equipment;sit to/from stand Pt Will Perform Lower Body Dressing: with set-up;with adaptive equipment;sit to/from stand Pt Will Transfer to Toilet: with modified independence;ambulating Pt Will Perform Toileting - Clothing Manipulation and hygiene: with modified independence;sit to/from stand Pt Will Perform Tub/Shower Transfer: Tub transfer;Shower transfer;with supervision;with set-up;ambulating;rolling walker (shower equipment tbd) Additional ADL Goal #1: Pt will be able to independently verbalize and demonstrate 3/3 back precautions.  Additional ADL Goal #2: Pt will be able to independently don/doff back brace while maintaining back precautions.   Visit Information  Last OT Received On: 06/04/13 Assistance Needed: +1 History of Present Illness: 78 yo M s/p posterior lumbar fusion.    Subjective Data      Prior Functioning       Cognition  Cognition Arousal/Alertness: Awake/alert Behavior During Therapy: WFL for  tasks assessed/performed Overall Cognitive Status: Within Functional Limits for tasks  assessed    Mobility  Bed Mobility Bed Mobility: Rolling Right;Right Sidelying to Sit;Sitting - Scoot to Edge of Bed Rolling Right: 4: Min guard Right Sidelying to Sit: 4: Min assist Sitting - Scoot to Edge of Bed: 5: Supervision Details for Bed Mobility Assistance: cues for precautions and technique. Transfers Transfers: Sit to Stand;Stand to Sit Sit to Stand: 4: Min guard;From bed;From chair/3-in-1;With upper extremity assist Stand to Sit: 4: Min guard;With upper extremity assist;To chair/3-in-1 Details for Transfer Assistance: cues for hand placement and technique.    Exercises      Balance     End of Session OT - End of Session Equipment Utilized During Treatment: Gait belt;Rolling walker;Back brace Activity Tolerance: Patient tolerated treatment well Patient left: in chair;Other (comment) (with PT)  GO     Earlie Raveling OTR/L [phone] 06/04/2013, 10:42 AM

## 2013-06-05 LAB — URINALYSIS, ROUTINE W REFLEX MICROSCOPIC
Glucose, UA: NEGATIVE mg/dL
Protein, ur: NEGATIVE mg/dL
Specific Gravity, Urine: 1.025 (ref 1.005–1.030)
pH: 5.5 (ref 5.0–8.0)

## 2013-06-05 LAB — URINE MICROSCOPIC-ADD ON

## 2013-06-05 LAB — GLUCOSE, CAPILLARY: Glucose-Capillary: 119 mg/dL — ABNORMAL HIGH (ref 70–99)

## 2013-06-05 NOTE — Progress Notes (Signed)
Referred to CSW  for ?SNF. Chart reviewed and have noted PT and OT recommending d/c to home with [facility] and DME.  CSW to sign off- please contact us if SW needs arise. Reece Levy, MSW, Theresia Majors [phone]

## 2013-06-05 NOTE — Progress Notes (Addendum)
Pt still having urinary retention.  Got verbal orders to I&O pt and insert Foley if retention was still an issue.  Pt requested that we skip the I&O cath and insert the Foley.  MD notified. Bladder scan showed 660 cc.  14Fr Foley replaced, approximately 600 cc clear yellow urine returned. Will continue to monitor.

## 2013-06-05 NOTE — Progress Notes (Signed)
Occupational Therapy Treatment Patient Details Name: Caleb Barrett MRN: [id number] DOB: 08-Feb-1935 Today's Date: 06/05/2013 Time: 8469-6295 OT Time Calculation (min): 39 min  OT Assessment / Plan / Recommendation  History of present illness 78 yo M s/p posterior lumbar fusion.   OT comments  Pt performed toileting and grooming tasks and practiced with sockaid. OT provided education to family present on tub transfer and shower transfer techniques. Pt limited by pain and had urgency to urinate 3x's.   Follow Up Recommendations  Home health OT;Supervision/Assistance - 24 hour    Barriers to Discharge       Equipment Recommendations  3 in 1 bedside comode;Other (comment) (AE kit)    Recommendations for Other Services    Frequency Min 2X/week   Progress towards OT Goals Progress towards OT goals: Not progressing toward goals - comment  Plan Discharge plan remains appropriate    Precautions / Restrictions Precautions Precautions: Back;Fall Precaution Comments: reviewed precautions with pt Required Braces or Orthoses: Spinal Brace Spinal Brace: Lumbar corset;Applied in sitting position Restrictions Weight Bearing Restrictions: No   Pertinent Vitals/Pain Pain 5/10 in back. Repositioned.     ADL  Grooming: Wash/dry hands;Min guard Where Assessed - Grooming: Supported standing Upper Body Dressing: Minimal assistance (back brace) Where Assessed - Upper Body Dressing: Unsupported sitting Lower Body Dressing: Minimal assistance (donned sock) Where Assessed - Lower Body Dressing: Supported sitting Toilet Transfer: Min Pension scheme manager Method: Sit to Barista: Raised toilet seat with arms (or 3-in-1 over toilet) Toileting - Clothing Manipulation and Hygiene: Minimal assistance Where Assessed - Engineer, mining and Hygiene: Standing Equipment Used: Gait belt;Back brace;Rolling walker;Sock aid;Reacher;Long-handled sponge;Long-handled shoe  horn Transfers/Ambulation Related to ADLs: Min guard for ambulation; Min A/Min guard for transfers ADL Comments: Practiced with sockaid. OT educated pt'f family who was in room about DME and tub and shower transfer techniques as well as using bag on walker, having rugs picked up in house (non skid rug in bathroom), using two cups for teeth care, and AE for LB ADLs.    OT Diagnosis:    OT Problem List:   OT Treatment Interventions:     OT Goals(current goals can now be found in the care plan section) Acute Rehab OT Goals Patient Stated Goal: not stated OT Goal Formulation: With patient Time For Goal Achievement: 06/10/13 Potential to Achieve Goals: Good ADL Goals Pt Will Perform Grooming: with modified independence;standing Pt Will Perform Lower Body Bathing: with set-up;with adaptive equipment;sit to/from stand Pt Will Perform Lower Body Dressing: with set-up;with adaptive equipment;sit to/from stand Pt Will Transfer to Toilet: with modified independence;ambulating Pt Will Perform Toileting - Clothing Manipulation and hygiene: with modified independence;sit to/from stand Pt Will Perform Tub/Shower Transfer: Tub transfer;Shower transfer;with supervision;with set-up;ambulating;rolling walker (shower equipment tbd) Additional ADL Goal #1: Pt will be able to independently verbalize and demonstrate 3/3 back precautions.  Additional ADL Goal #2: Pt will be able to independently don/doff back brace while maintaining back precautions.   Visit Information  Last OT Received On: 06/05/13 Assistance Needed: +1 History of Present Illness: 78 yo M s/p posterior lumbar fusion.    Subjective Data      Prior Functioning       Cognition  Cognition Arousal/Alertness: Awake/alert Behavior During Therapy: WFL for tasks assessed/performed Overall Cognitive Status: Impaired/Different from baseline Area of Impairment: Memory Memory: Decreased recall of precautions    Mobility  Bed Mobility Bed  Mobility: Sit to Sidelying Left;Rolling Right;Rolling Left;Sitting - Scoot to [place] of  Bed Rolling Right: 4: Min assist Rolling Left: 5: Supervision Right Sidelying to Sit: 4: Min guard Sitting - Scoot to Edge of Bed: 4: Min guard Sit to Sidelying Left: 3: Mod assist Details for Bed Mobility Assistance: Assist to lift legs onto bed.  Transfers Transfers: Sit to Stand;Stand to Sit Sit to Stand: 4: Min guard;4: Min assist;With upper extremity assist;From bed;From chair/3-in-1 Stand to Sit: 4: Min guard;To chair/3-in-1;To bed Details for Transfer Assistance: cues for hand placement and technique    Exercises      Balance     End of Session OT - End of Session Equipment Utilized During Treatment: Gait belt;Rolling walker;Back brace Activity Tolerance: Patient limited by pain;Other (comment) (urgency to urinate 3x's) Patient left: in bed;with call bell/phone within reach;with family/visitor present  GO     Earlie Raveling OTR/L [phone] 06/05/2013, 2:57 PM

## 2013-06-05 NOTE — Progress Notes (Signed)
Physical Therapy Treatment Patient Details Name: Caleb Barrett MRN: [id number] DOB: 1935/02/08 Today's Date: 06/05/2013 Time: 4098-1191 PT Time Calculation (min): 32 min  PT Assessment / Plan / Recommendation  History of Present Illness 78 yo M s/p posterior lumbar fusion.   PT Comments   Pt struggled with mobility this session, noting RIGHT>Left LE weakness and flexed trunk posture with gait.  On arrival pt states he has days and nights mixed up but appears oriented.  Attempted to urinate with minimal output, RN verbally updated and monitoring.  Continuing to follow acutely.   Follow Up Recommendations  Home health PT;Supervision/Assistance - 24 hour (unless continues to backslide...)     Does the patient have the potential to tolerate intense rehabilitation     Barriers to Discharge        Equipment Recommendations  Rolling walker with 5" wheels;3in1 (PT)    Recommendations for Other Services    Frequency Min 5X/week   Progress towards PT Goals Progress towards PT goals: Not progressing toward goals - comment (more difficulty today with mobility)  Plan Current plan remains appropriate    Precautions / Restrictions Precautions Precautions: Back Precaution Comments: needs cues regarding precautions Required Braces or Orthoses: Spinal Brace Spinal Brace: Lumbar corset;Applied in sitting position Restrictions Weight Bearing Restrictions: No   Pertinent Vitals/Pain States 4/10 with fatigue RLE noted during gait and heavy dependence on RW    Mobility  Bed Mobility Bed Mobility: Not assessed (in/back to chair) Transfers Transfers: Sit to Stand;Stand to Sit Sit to Stand: 4: Min guard;With upper extremity assist;From bed;From chair/3-in-1;With armrests Stand to Sit: 4: Min guard;With upper extremity assist;To chair/3-in-1;With armrests Details for Transfer Assistance: verbal instruction for scooting out and using grab bars/arm rests to stand; verbal cues to align with  surface before sitting; cues to come to full stand prior to initiating gait Ambulation/Gait Ambulation/Gait Assistance: 4: Min assist Ambulation Distance (Feet): 75 Feet Assistive device: Rolling walker Ambulation/Gait Assistance Details: instructional cues for posture, proximity to device; adjusted RW down 1" for optimal hand positioning; cues to increase esp RIGHT step length and to hold head up and to keep knees 'strong' when walking noting rapid fatiguing of right LE and heavy dependence on device for support due to leg weakness.   Gait Pattern: Step-through pattern;Decreased step length - right;Right flexed knee in stance;Antalgic;Trunk flexed;Narrow base of support Gait velocity: decreased General Gait Details: definite increased difficulty vs. previous session, with more difficulty overall with ambulation Stairs: No Wheelchair Mobility Wheelchair Mobility: No    Exercises     PT Diagnosis:    PT Problem List:   PT Treatment Interventions:     PT Goals (current goals can now be found in the care plan section)    Visit Information  Last PT Received On: 06/05/13 Assistance Needed: +1 History of Present Illness: 78 yo M s/p posterior lumbar fusion.    Subjective Data  Subjective: states getting days and nights mixed up   Cognition  Cognition Arousal/Alertness: Lethargic Behavior During Therapy: Impulsive;Restless (did have urge to urinate, better over time) Overall Cognitive Status: Impaired/Different from baseline Area of Impairment: Awareness;Problem solving Memory: Decreased recall of precautions Awareness: Emergent Problem Solving: Slow processing;Difficulty sequencing;Requires verbal cues;Requires tactile cues General Comments: Pt states mixed up about days/nights.  Sleeping and arouseable upon arrival, oriented to date, self, situation; slow to respond initially, distracted by urge to urinate;  all reported to RN who took urine sample in context of retention and  confusion;  pt admits  to not drinking much, encouraged to increase intake; opened window blind for increased daylight; encouraged to be walking with nursing throughout day    Balance     End of Session PT - End of Session Equipment Utilized During Treatment: Gait belt Activity Tolerance: Patient limited by lethargy Patient left: in chair;with call bell/phone within reach (instructed to limit sitting to <60 minute bouts) Nurse Communication: Mobility status;Other (comment) (lethargy/confusion; RN sent urine sample r/o UTI)   GP     Dennis Bast 06/05/2013, 11:46 AM

## 2013-06-05 NOTE — Progress Notes (Signed)
Pt has been voiding throughout the morning, although not completely emptying bladder.  Bladder scan showed less than 100 cc retained, pt does not report any discomfort/pressure.  Will re-scan at later time.

## 2013-06-05 NOTE — Progress Notes (Signed)
Pt c/o pain in catheter site intermittently and felt the sensation that he was "wetting" himself. No wetness noted, catheter appeared normal; patient told to call if it persisted. Pt called back, gown and bedpad noticably wet. Checked balloon inflation which was normal, but foley removed secondary to leakage. When removed large clot noted towards the tip of the catheter. Patient's urine had red tint to it, which it had not at the beginning of the shift. Foley left out for patient to try to void independently. Will continue to monitor.

## 2013-06-05 NOTE — Progress Notes (Signed)
No issues overnight. Pt had foley replaced yesterday for retention, removed again this am. He has voided this am. Back is sore but ambulating in hallway with walker.  EXAM:  BP 144/62  Pulse 79  Temp(Src) 97.9 F (36.6 C) (Oral)  Resp 20  Ht 5\' 10"  (1.778 m)  Wt 114.851 kg (253 lb 3.2 oz)  BMI 36.33 kg/m2  SpO2 95%  Awake, alert, oriented  Speech fluent, appropriate  CN grossly intact  5/5 BUE/BLE   IMPRESSION:  78 y.o. male POD# 3 s/p L2-5 fusion  PLAN: - Cont to mobilize with PT - Monitor UO for retention. Will hold-off on replacing foley now with trial of void.

## 2013-06-06 LAB — GLUCOSE, CAPILLARY
Glucose-Capillary: 132 mg/dL — ABNORMAL HIGH (ref 70–99)
Glucose-Capillary: 111 mg/dL — ABNORMAL HIGH (ref 70–99)
Glucose-Capillary: 145 mg/dL — ABNORMAL HIGH (ref 70–99)
Glucose-Capillary: 131 mg/dL — ABNORMAL HIGH (ref 70–99)

## 2013-06-06 LAB — URINALYSIS, ROUTINE W REFLEX MICROSCOPIC
Bilirubin Urine: NEGATIVE
Glucose, UA: NEGATIVE mg/dL
Ketones, ur: NEGATIVE mg/dL
Nitrite: NEGATIVE
Urobilinogen, UA: 1 mg/dL (ref 0.0–1.0)
pH: 6 (ref 5.0–8.0)

## 2013-06-06 LAB — URINE MICROSCOPIC-ADD ON

## 2013-06-06 MED ORDER — TAMSULOSIN HCL 0.4 MG PO CAPS
0.4000 mg | ORAL_CAPSULE | Freq: Every day | ORAL | Status: DC
  Administered 2013-06-06 – 2013-06-07 (×2): 0.4 mg via ORAL
  Filled 2013-06-06 (×3): qty 1

## 2013-06-06 NOTE — Progress Notes (Signed)
Subjective: Patient reports getting better  Objective: Vital signs in last 24 hours: Temp:  [97.9 F (36.6 C)-98.5 F (36.9 C)] 98.1 F (36.7 C) (10/27 2725) Pulse Rate:  [79-90] 90 (10/27 0632) Resp:  [18-20] 20 (10/27 3664) BP: (110-154)/(39-65) 111/46 mmHg (10/27 0632) SpO2:  [89 %-98 %] 94 % (10/27 4034)  Intake/Output from previous day: 10/26 0701 - 10/27 0700 In: 100 [P.O.:100] Out: 3650 [Urine:3650] Intake/Output this shift:    Physical Exam: Strength good in legs.  Up in brace.  Has been walking  Lab Results: No results found for this basename: WBC, HGB, HCT, PLT,  in the last 72 hours BMET No results found for this basename: NA, K, CL, CO2, GLUCOSE, BUN, CREATININE, CALCIUM,  in the last 72 hours  Studies/Results: No results found.  Assessment/Plan: Foley replaced for urinary retention.  Will start flomax today.  Work on bowels.  Continue PT.    LOS: 4 days    Dorian Heckle, MD 06/06/2013, 7:26 AM

## 2013-06-06 NOTE — Progress Notes (Addendum)
(  Late entry) Foley was reinserted 10/26 at 1430 per MD order for urinary retention.  Urine is pink tinged with several small clots.  Will reassess need for foley today.

## 2013-06-06 NOTE — Progress Notes (Signed)
Pt has allergies to Sulfa, ok'd with pharmacy per Dr. Venetia Maxon to administer Flomax.

## 2013-06-06 NOTE — Progress Notes (Signed)
Physical Therapy Treatment Patient Details Name: Caleb Barrett MRN: [id number] DOB: [birth year] Today's Date: 06/06/2013 Time: 4098-1191 PT Time Calculation (min): 25 min  PT Assessment / Plan / Recommendation  History of Present Illness 78 yo M s/p posterior lumbar fusion.   PT Comments   Patient demonstrates continued limitations with ambulation. Heavy reliance of RW and increased pain today.  Spoke with patient at length regarding positional changes as patient reports he is sitting in chair for hours at a time. Will continue to work with patient and progress activity as tolerated.   Follow Up Recommendations  Home health PT;Supervision/Assistance - 24 hour (unless continues to backslide...)           Equipment Recommendations  Rolling walker with 5" wheels;3in1 (PT)       Frequency Min 5X/week   Progress towards PT Goals Progress towards PT goals: Not progressing toward goals - comment  Plan Current plan remains appropriate    Precautions / Restrictions Precautions Precautions: Back;Fall Precaution Comments: reviewed precautions with pt Required Braces or Orthoses: Spinal Brace Spinal Brace: Lumbar corset;Applied in sitting position Restrictions Weight Bearing Restrictions: No   Pertinent Vitals/Pain At least a 6/10 reported pain; also complains of pain/discomort in penis/groin region.    Mobility  Bed Mobility Bed Mobility: Not assessed Transfers Transfers: Sit to Stand;Stand to Sit Sit to Stand: 4: Min guard;4: Min assist;With upper extremity assist;From bed;From chair/3-in-1 Stand to Sit: 4: Min guard;To chair/3-in-1;To bed (VCs for controlled descent ) Details for Transfer Assistance: VCs for hand placement and technique Ambulation/Gait Ambulation/Gait Assistance: 5: Supervision;4: Min guard Ambulation Distance (Feet): 160 Feet Assistive device: Rolling walker Ambulation/Gait Assistance Details: VCs for upright posture and standing rest breaks, heavy reliance  on RW and shuffle gait today. Pt also cued for increae cadence. Gait Pattern: Step-through pattern;Decreased stride length;Trunk flexed Gait velocity: decreased General Gait Details: Increased gait deviations, more reliance on rolling walker today compared to prior sessions, ? if 2/2  increased pain/discomfort in groin region. Will continue to monitor for progress Stairs: No      PT Goals (current goals can now be found in the care plan section) Acute Rehab PT Goals Patient Stated Goal: not stated  Visit Information  Last PT Received On: 06/06/13 Assistance Needed: +1 History of Present Illness: 78 yo M s/p posterior lumbar fusion.    Subjective Data  Subjective: I just want to be able to pee and it not burn Patient Stated Goal: not stated   Cognition  Cognition Arousal/Alertness: Awake/alert Behavior During Therapy: WFL for tasks assessed/performed Overall Cognitive Status: Impaired/Different from baseline Area of Impairment: Memory Memory: Decreased recall of precautions    Balance  Static Sitting Balance Static Sitting - Balance Support: Feet supported Static Sitting - Level of Assistance: 7: Independent High Level Balance High Level Balance Activites: Side stepping;Direction changes;Turns High Level Balance Comments: VCs for turns without twisting  End of Session PT - End of Session Equipment Utilized During Treatment: Gait belt Activity Tolerance: Patient limited by lethargy Patient left: in chair;with call bell/phone within reach (cont. to report hours in seated position, VC for <60 mins) Nurse Communication: Mobility status   GP     Caleb Barrett 06/06/2013, 8:33 AM Charlotte Crumb, PT DPT  [phone]

## 2013-06-06 NOTE — Progress Notes (Addendum)
Occupational Therapy Treatment Patient Details Name: Caleb Barrett MRN: [id number] DOB: 06-Feb-1935 Today's Date: 06/06/2013 Time: 4098-1191 OT Time Calculation (min): 39 min  OT Assessment / Plan / Recommendation  History of present illness 78 yo M s/p posterior lumbar fusion.   OT comments  Pt not oriented to time or place when OT entered room. Performed grooming at sink. Educated family on bed mobility and pt practiced as well as with back brace. Pt limited by pain.   Follow Up Recommendations  Home health OT;Supervision/Assistance - 24 hour    Barriers to Discharge       Equipment Recommendations  3 in 1 bedside comode;Other (comment) (AE kit)    Recommendations for Other Services    Frequency Min 2X/week   Progress towards OT Goals Progress towards OT goals: Not progressing toward goals - comment  Plan Discharge plan remains appropriate    Precautions / Restrictions Precautions Precautions: Back;Fall Precaution Comments: reviewed precautions with pt-poor recall Required Braces or Orthoses: Spinal Brace Spinal Brace: Lumbar corset;Applied in sitting position Restrictions Weight Bearing Restrictions: No   Pertinent Vitals/Pain Pain in back. Repositioned.     ADL  Grooming: Performed;Wash/dry face;Teeth care;Minimal assistance;Brushing hair Where Assessed - Grooming: Supported standing Upper Body Dressing: Minimal assistance (back brace) Where Assessed - Upper Body Dressing: Unsupported sitting Toilet Transfer: Minimal assistance Toilet Transfer Method: Sit to stand Toilet Transfer Equipment: Bedside commode Equipment Used: Gait belt;Back brace;Rolling walker Transfers/Ambulation Related to ADLs: Min guard/Min A for ambulation. Min guard/Min A for transfers. ADL Comments: Pt ambulated to bathroom and performed grooming tasks. Used two cups for teeth care with OT assisting. Pt in lots of pain today and not oriented to place or time. Educated family on bed mobility as  well as back brace.  Discussed toilet tongs with daughter.  OT also demonstrated/explained shower transfer at beginning of session, but did not feel it was safe for pt to attempt today.    OT Diagnosis:    OT Problem List:   OT Treatment Interventions:     OT Goals(current goals can now be found in the care plan section) Acute Rehab OT Goals Patient Stated Goal: not stated OT Goal Formulation: With patient Time For Goal Achievement: 06/10/13 Potential to Achieve Goals: Good ADL Goals Pt Will Perform Grooming: with modified independence;standing Pt Will Perform Lower Body Bathing: with set-up;with adaptive equipment;sit to/from stand Pt Will Perform Lower Body Dressing: with set-up;with adaptive equipment;sit to/from stand Pt Will Transfer to Toilet: with modified independence;ambulating Pt Will Perform Toileting - Clothing Manipulation and hygiene: with modified independence;sit to/from stand Pt Will Perform Tub/Shower Transfer: Shower transfer;with supervision;with set-up;ambulating;rolling walker;3 in 1 Additional ADL Goal #1: Pt will be able to independently verbalize and demonstrate 3/3 back precautions.  Additional ADL Goal #2: Pt will be able to independently don/doff back brace while maintaining back precautions.   Visit Information  Last OT Received On: 06/06/13 Assistance Needed: +1 History of Present Illness: 78 yo M s/p posterior lumbar fusion.    Subjective Data      Prior Functioning       Cognition  Cognition Arousal/Alertness: Lethargic Behavior During Therapy: WFL for tasks assessed/performed Overall Cognitive Status: Impaired/Different from baseline Area of Impairment: Orientation;Memory Orientation Level: Disoriented to;Place;Time Memory: Decreased recall of precautions;Decreased short-term memory General Comments: Pt thought he was at home and also did not know what year it was.    Mobility  Bed Mobility Bed Mobility: Sit to Sidelying Right;Rolling  Left;Rolling Right;Right Sidelying to Sit Rolling  Right: 4: Min assist Rolling Left: 4: Min guard Right Sidelying to Sit: 3: Mod assist Sit to Sidelying Right: 3: Mod assist Details for Bed Mobility Assistance: Assist to lift trunk from sidelying position. Assisted with legs when going to sidelying position. Cues for technique. Educated family. Transfers Transfers: Sit to Stand;Stand to Sit Sit to Stand: 4: Min assist;With upper extremity assist;From chair/3-in-1;From bed Stand to Sit: 4: Min guard;To bed;To chair/3-in-1 Details for Transfer Assistance: cues for hand placement.    Exercises      Balance     End of Session OT - End of Session Equipment Utilized During Treatment: Gait belt;Rolling walker;Back brace Activity Tolerance: Patient limited by pain Patient left: in chair;with family/visitor present Nurse Communication: Other (comment) (cognitive status)  GO     Earlie Raveling OTR/L [phone] 06/06/2013, 5:51 PM

## 2013-06-06 NOTE — Progress Notes (Signed)
Pt had brief episode of confusion today upon 1600 neuro check.  Pt became disoriented to time and place.  UA sent, no results yet.  Pt now oriented x4.  Will continue to monitor.

## 2013-06-07 ENCOUNTER — Encounter (HOSPITAL_COMMUNITY): Payer: Self-pay | Admitting: *Deleted

## 2013-06-07 LAB — GLUCOSE, CAPILLARY: Glucose-Capillary: 120 mg/dL — ABNORMAL HIGH (ref 70–99)

## 2013-06-07 NOTE — Discharge Summary (Signed)
Physician Discharge Summary  Patient ID: Caleb Barrett MRN: [id number] DOB/AGE: [birth year] 78 y.o.  Admit date: 06/02/2013 Discharge date: 06/07/2013  Admission Diagnoses: Spondylolisthesis, Lumbar stenosis, Spondylosis, Radiculopathy L 23, L 34, L 45   Discharge Diagnoses: Spondylolisthesis, Lumbar stenosis, Spondylosis, Radiculopathy L 23, L 34, L 45 s/p POSTERIOR LUMBAR FUSION 3 LEVEL,LUMBAR TWO-LUMBAR FIVE (N/A) Lumbar decompression L 2 - L 5 levels with pedicle screw fixation and posterolateral arthrodesis   Active Problems:   * No active hospital problems. *   Discharged Condition: good  Hospital Course: Caleb Barrett was admitted for surgery on 06/02/13 with dx Spondylolisthesis, Lumbar stenosis, Spondylosis, Radiculopathy L 23, L 34, L 45. Following uncomplicated decompression and fusion, he recovered nicely in Neuro PACU & transferred to 4N for nursing care and therapies. He has progressed well, only limited by urinary retention and Foley replacement.    Consults: None  Significant Diagnostic Studies: radiology: X-Ray: intra-operative  Treatments: surgery: POSTERIOR LUMBAR FUSION 3 LEVEL,LUMBAR TWO-LUMBAR FIVE (N/A) Lumbar decompression L 2 - L 5 levels with pedicle screw fixation and posterolateral arthrodesis    Discharge Exam: Blood pressure 114/68, pulse 71, temperature 98.8 F (37.1 C), temperature source Oral, resp. rate 20, height 5\' 10"  (1.778 m), weight 114.851 kg (253 lb 3.2 oz), SpO2 98.00%. Alert, conversant. Lumbar drsg intact, incision without erythema, swelling, or drainage. Good strength BLE. Foley draining clear yellow urine - replaced yesterday afternoon. Flomax began last night. No BM yet;awaiting result of Dulcolax suppository. Spoke with Dr. Dwana Curd of [facility]. Ok to have pt call for 1st available appt (with any MD) [phone]) upon d/c from hospital. They typically work pt in in 3-4 days & remove foley.   Will d/c home with foley and  Flomax rx.   Disposition:Discharge to home. Discussed plan with pt, who verbalizes understanding. Rx's to pt for Flomax, Valium, & Percocet. HHPT/OT, RW, 3-in-1. Pt will call [facility] for appt in 3-4 days with 1st available MD. Pt will call our office to schedule 3-4 wk f/u with DrStern.    Future Appointments Provider Department Dept Phone   11/22/2013 1:00 PM [facility] [place] [facility] [address] [phone]   11/22/2013 2:00 PM Larina Earthly, MD [facility] [phone]       Medication List    ASK your doctor about these medications       allopurinol 300 MG tablet  Commonly known as:  ZYLOPRIM  Take 300 mg by mouth daily.     aspirin 81 MG tablet  Take 81 mg by mouth daily.     clopidogrel 75 MG tablet  Commonly known as:  PLAVIX  Take 75 mg by mouth daily.     fish oil-omega-3 fatty acids 1000 MG capsule  Take 1 g by mouth daily.     gabapentin 300 MG capsule  Commonly known as:  NEURONTIN  Take 300 mg by mouth 2 (two) times daily.     lisinopril-hydrochlorothiazide 20-12.5 MG per tablet  Commonly known as:  PRINZIDE,ZESTORETIC  Take 2 tablets by mouth daily.     metFORMIN 500 MG tablet  Commonly known as:  GLUCOPHAGE  Take 500 mg by mouth daily.     pravastatin 40 MG tablet  Commonly known as:  PRAVACHOL  Take 40 mg by mouth daily.     PRESERVISION AREDS PO  Take 1 tablet by mouth 2 (two) times daily.         Signed: Georgiann Cocker 06/07/2013, 10:20  AM

## 2013-06-07 NOTE — Progress Notes (Signed)
Physical Therapy Treatment Patient Details Name: Norvil Martensen MRN: [id number] DOB: 22-Dec-1934 Today's Date: 06/07/2013 Time: 4098-1191 PT Time Calculation (min): 26 min  PT Assessment / Plan / Recommendation  History of Present Illness 78 yo M s/p posterior lumbar fusion.   PT Comments   Patient demonstrates ability to perform minimal stair negotiation at this time. Recommend patient remain on first level of home until cleared by HHPT secondary to pain with mobility.  Educated patient and spouse regarding precautions, mobility concerns and expectations for discharge.   Follow Up Recommendations  Home health PT;Supervision/Assistance - 24 hour (unless continues to backslide...)     Does the patient have the potential to tolerate intense rehabilitation     Barriers to Discharge        Equipment Recommendations  Rolling walker with 5" wheels;3in1 (PT)    Recommendations for Other Services    Frequency Min 5X/week   Progress towards PT Goals Progress towards PT goals: Progressing toward goals  Plan Current plan remains appropriate    Precautions / Restrictions Precautions Precautions: Back;Fall Precaution Comments: reviewed precautions with pt Required Braces or Orthoses: Spinal Brace Spinal Brace: Lumbar corset;Applied in sitting position Restrictions Weight Bearing Restrictions: No   Pertinent Vitals/Pain 10/10 penile pain and discomfort, 8/10 back pain, +BM     Mobility  Bed Mobility Bed Mobility: Not assessed Transfers Transfers: Sit to Stand;Stand to Sit Sit to Stand: 4: Min guard;4: Min assist;With upper extremity assist;From chair/3-in-1;From toilet Stand to Sit: 4: Min guard;To chair/3-in-1;To toilet (VCs for controlled descent ) Details for Transfer Assistance: VCs for hand placement and technique Ambulation/Gait Ambulation/Gait Assistance: 5: Supervision;4: Min guard Ambulation Distance (Feet): 60 Feet Assistive device: Rolling walker Ambulation/Gait  Assistance Details: VCs for upright posture and standing rest breaks, heavy reliance on RW and shuffle gait today. Pt also cued for increae cadence. Gait Pattern: Step-through pattern;Decreased stride length;Trunk flexed Gait velocity: decreased General Gait Details: Increased gait deviations, more reliance on rolling walker today compared to prior sessions, ? if 2/2  increased pain/discomfort in groin region. Will continue to monitor for progress Stairs: Yes Stairs Assistance: 5: Supervision;4: Min guard Stairs Assistance Details (indicate cue type and reason): instructed to use step to method Stair Management Technique: Two rails;Step to pattern;Forwards, 5 steps performed x 2    PT Goals (current goals can now be found in the care plan section) Acute Rehab PT Goals Patient Stated Goal: not stated PT Goal Formulation: With patient Time For Goal Achievement: 06/17/13 Potential to Achieve Goals: Good  Visit Information  Last PT Received On: 06/07/13 Assistance Needed: +1 History of Present Illness: 78 yo M s/p posterior lumbar fusion.    Subjective Data  Subjective: I need to use the bathroom Patient Stated Goal: not stated   Cognition  Cognition Arousal/Alertness: Awake/alert Behavior During Therapy: WFL for tasks assessed/performed Overall Cognitive Status: Impaired/Different from baseline Area of Impairment: Memory Memory: Decreased recall of precautions    Balance  Static Sitting Balance Static Sitting - Balance Support: Feet supported Static Sitting - Level of Assistance: 7: Independent High Level Balance High Level Balance Activites: Side stepping;Direction changes;Turns High Level Balance Comments: VCs for turns without twisting  End of Session PT - End of Session Equipment Utilized During Treatment: Gait belt Activity Tolerance: Patient limited by lethargy Patient left: in chair;with call bell/phone within reach (cont. to report hours in seated position) Nurse  Communication: Mobility status;Other (comment)   GP     Fabio Asa 06/07/2013, 12:10 PM [facility]  Barb Merino, [place] DPT  [phone]

## 2013-06-07 NOTE — Care Management Note (Signed)
    Page 1 of 2   06/07/2013     2:08:42 PM   CARE MANAGEMENT NOTE 06/07/2013  Patient:  Caleb Barrett, Caleb Barrett   Account Number:  [id number]  Date Initiated:  06/07/2013  Documentation initiated by:  Elmer Bales  Subjective/Objective Assessment:   Patient admitted for PLIF. Lives at home.     Action/Plan:   Will follow for discharge needs.   Anticipated DC Date:  06/07/2013   Anticipated DC Plan:  HOME W HOME HEALTH SERVICES      DC Planning Services  CM consult      Choice offered to / List presented to:     DME arranged  [facility] - Lavone Nian      DME agency  [facility]     [facility] arranged  HH-3 OT  HH-2 PT      Status of service:   Medicare Important Message given?   (If response is "NO", the following Medicare IM given date fields will be blank) Date Medicare IM given:   Date Additional Medicare IM given:    Discharge Disposition:  HOME W HOME HEALTH SERVICES  Per UR Regulation:  Reviewed for med. necessity/level of care/duration of stay  If discussed at Long Length of Stay Meetings, dates discussed:    Comments:  06/07/13 1200 Elmer Bales RN, MSN, CM- Met with patient and wife, who have chosen [facility] for [facility] therapy.  Mary with [facility] was notified and has accepted the referral.   06/07/13 1030 Elmer Bales RN, MSN, CM- Met with patient to discuss home health therapies.  Patient is agreeable to home health, but would like to discuss the agency list with family prior to making a decision.  CM asked patient to notify when decision is made.  RN aware. Message left for [facility] DME regarding equpment needs for discharge home today.

## 2013-06-07 NOTE — Progress Notes (Signed)
Will d/w Urology about D/C home with catheter.

## 2013-06-07 NOTE — Progress Notes (Signed)
Discharge home with indwelling Foley and flomax.  F/U scheduled with Urologist.

## 2013-06-07 NOTE — Progress Notes (Signed)
Pt discharge home with wife for home health PT/ [facility]. Pt left with indwelling catheter for retention of pain . Instructed to see  [facility] urology in [place]  ASAP phone # [phone] given to pt and wife .Incision dry clean and intact, care and follow up with Dr Venetia Maxon in 2-3 week also reinforced. Both patient and wife verbalized good understanding.condition at d/c is stable

## 2013-06-07 NOTE — Progress Notes (Signed)
Patient ID: Caleb Barrett, male   DOB: 1935-03-31, 78 y.o.   MRN: [id number]  Spoke with Dr. Dwana Curd of [facility]. Ok to have pt call for 1st available appt (with any MD) [phone]) upon d/c from hospital. They typically work pt in in 3-4 days & remove foley.  Per Dr. Venetia Maxon, ok to d/c home with foley and Flomax rx. Discussed plan with pt, who verbalizes understanding. Rx's to pt for Flomax, Valium, & Percocet. HHPT/OT, RW, 3-in-1. Pt will call ffice to schedule 3-4 wk f/u with Caleb Barrett.  Georgiann Cocker, RN, BSN

## 2013-06-07 NOTE — Progress Notes (Signed)
Subjective: Patient reports "My back doesn't hurt...just the bladder and these catheters.Marland KitchenMarland KitchenI've had five"  Objective: Vital signs in last 24 hours: Temp:  [97.1 F (36.2 C)-98.4 F (36.9 C)] 97.6 F (36.4 C) (10/28 0553) Pulse Rate:  [77-90] 77 (10/28 0553) Resp:  [16-20] 18 (10/28 0553) BP: (101-141)/(37-79) 130/48 mmHg (10/28 0553) SpO2:  [95 %-97 %] 96 % (10/28 0553)  Intake/Output from previous day: 10/27 0701 - 10/28 0700 In: 123 [P.O.:120; I.V.:3] Out: 2100 [Urine:2100] Intake/Output this shift:    Alert, conversant, up in chair. Lumbar drsg intact, incision without erythema, swelling, or drainage. Good strength BLE. Foley draining clear yellow urine - replaced yesterday afternoon. Flomax began last night. No BM yet; but pt denies discomfort - agrees to Dulcolax suppository.  Lab Results: No results found for this basename: WBC, HGB, HCT, PLT,  in the last 72 hours BMET No results found for this basename: NA, K, CL, CO2, GLUCOSE, BUN, CREATININE, CALCIUM,  in the last 72 hours  Studies/Results: No results found.  Assessment/Plan:    LOS: 5 days  Will work on bowels this am, continue to mobilize in LSO. Will d/w Dr. Venetia Maxon plan for Foley catheter.   Georgiann Cocker 06/07/2013, 8:16 AM

## 2013-09-14 ENCOUNTER — Other Ambulatory Visit: Payer: Self-pay | Admitting: Vascular Surgery

## 2013-09-14 DIAGNOSIS — R55 Syncope and collapse: Secondary | ICD-10-CM

## 2013-09-14 DIAGNOSIS — I6529 Occlusion and stenosis of unspecified carotid artery: Secondary | ICD-10-CM

## 2013-11-22 ENCOUNTER — Other Ambulatory Visit (HOSPITAL_COMMUNITY): Payer: Medicare Other

## 2013-11-22 ENCOUNTER — Ambulatory Visit: Payer: Medicare Other | Admitting: Vascular Surgery

## 2013-12-26 ENCOUNTER — Encounter: Payer: Self-pay | Admitting: Vascular Surgery

## 2013-12-27 ENCOUNTER — Encounter: Payer: Self-pay | Admitting: Vascular Surgery

## 2013-12-27 ENCOUNTER — Ambulatory Visit (HOSPITAL_COMMUNITY)
Admission: RE | Admit: 2013-12-27 | Discharge: 2013-12-27 | Disposition: A | Discharge status: Home / Self Care | Payer: Medicare HMO | Source: Ambulatory Visit | Attending: Vascular Surgery | Admitting: Vascular Surgery

## 2013-12-27 ENCOUNTER — Ambulatory Visit (INDEPENDENT_AMBULATORY_CARE_PROVIDER_SITE_OTHER): Payer: Medicare HMO | Admitting: Vascular Surgery

## 2013-12-27 VITALS — BP 91/63 | HR 75 | Ht 70.0 in | Wt 227.0 lb

## 2013-12-27 DIAGNOSIS — I6529 Occlusion and stenosis of unspecified carotid artery: Secondary | ICD-10-CM

## 2013-12-27 DIAGNOSIS — R55 Syncope and collapse: Secondary | ICD-10-CM

## 2013-12-27 DIAGNOSIS — I658 Occlusion and stenosis of other precerebral arteries: Secondary | ICD-10-CM | POA: Insufficient documentation

## 2013-12-27 NOTE — Progress Notes (Signed)
Patient has today for followup of his extracranial cerebrovascular occlusive disease. His only issue today is of persistent back pain following back surgery. He reports this is having some improvement with the exercise program. He specifically denies any new neurologic deficits. Specifically no amaurosis fugax, transient ischemic attack or stroke.  Past Medical History  Diagnosis Date  . Hyperlipidemia   . Hypertension   . Neuropathy   . Gout   . Colon polyps   . Eczema   . Carotid bruit 12/29/11    carotid duplex: known occluded left ICA, mild disease at the right carotid bifurcation w/o significant stenosis  . Macular degeneration   . Cough syncope   . Diabetes mellitus without complication   . Peripheral vascular disease   . PONV (postoperative nausea and vomiting)   . Sleep apnea   . Stroke   . Headache(784.0)     seasonal headache  . Neuromuscular disorder     nerve damage in  feet    History  Substance Use Topics  . Smoking status: Former Smoker -- 60 years    Quit date: 09/18/1995  . Smokeless tobacco: Not on file  . Alcohol Use: No    Family History  Problem Relation Age of Onset  . Thyroid disease Mother   . Heart disease Mother   . Diabetes Mother   . Cancer Mother   . Heart attack Mother   . Cancer Father     lung  . Hypertension Father   . Diabetes Father   . Cancer Brother     colon  . Heart disease Brother   . Cancer Daughter   . Cancer Son     Allergies  Allergen Reactions  . Penicillins   . Sulfa Antibiotics     Current outpatient prescriptions:allopurinol (ZYLOPRIM) 300 MG tablet, Take 300 mg by mouth daily., Disp: , Rfl: ;  aspirin 81 MG tablet, Take 81 mg by mouth daily., Disp: , Rfl: ;  clopidogrel (PLAVIX) 75 MG tablet, Take 75 mg by mouth daily., Disp: , Rfl: ;  gabapentin (NEURONTIN) 300 MG capsule, Take 300 mg by mouth 2 (two) times daily., Disp: , Rfl:  lisinopril-hydrochlorothiazide (PRINZIDE,ZESTORETIC) 20-12.5 MG per tablet, Take 2  tablets by mouth daily., Disp: , Rfl: ;  metFORMIN (GLUCOPHAGE) 500 MG tablet, Take 500 mg by mouth daily., Disp: , Rfl: ;  Multiple Vitamins-Minerals (PRESERVISION AREDS PO), Take 1 tablet by mouth 2 (two) times daily., Disp: , Rfl: ;  pravastatin (PRAVACHOL) 40 MG tablet, Take 40 mg by mouth daily., Disp: , Rfl:  fish oil-omega-3 fatty acids 1000 MG capsule, Take 1 g by mouth daily., Disp: , Rfl:   BP 91/63  Pulse 75  Ht 5\' 10"  (1.778 m)  Wt 227 lb (102.967 kg)  BMI 32.57 kg/m2  SpO2 98%  Body mass index is 32.57 kg/(m^2).       Physical exam: Well-developed well-nourished gentleman in no acute distress He is grossly intact neurologically Carotid arteries without bruits bilaterally Heart regular rate and rhythm without murmur Respirations equal and nonlabored 2+ right radial pulse and 1+ left radial pulse  Noninvasive vascular study was reviewed with the patient from today. This shows no change. He does have a probable occlusion of his left internal carotid artery. His right common carotid artery has moderate to severe stenosis. He has nonvisualized flow in his left vertebral artery with probable subclavian artery occlusion or stenosis. No change from one year ago  Impression and plan: Stable diffuse intracranial  cerebrovascular occlusive disease. He remains asymptomatic. He will notify should he develop any neurologic deficits. Otherwise we'll see him on a yearly basis 2 continue followup of his disease

## 2013-12-27 NOTE — Addendum Note (Signed)
Addended by: Mena Goes on: 12/27/2013 01:25 PM   Modules accepted: Orders

## 2014-02-20 ENCOUNTER — Encounter: Payer: Self-pay | Admitting: *Deleted

## 2014-09-18 DIAGNOSIS — I10 Essential (primary) hypertension: Secondary | ICD-10-CM | POA: Diagnosis not present

## 2014-09-18 DIAGNOSIS — E1140 Type 2 diabetes mellitus with diabetic neuropathy, unspecified: Secondary | ICD-10-CM | POA: Diagnosis not present

## 2014-09-18 DIAGNOSIS — E782 Mixed hyperlipidemia: Secondary | ICD-10-CM | POA: Diagnosis not present

## 2014-09-18 DIAGNOSIS — R0989 Other specified symptoms and signs involving the circulatory and respiratory systems: Secondary | ICD-10-CM | POA: Diagnosis not present

## 2014-09-21 DIAGNOSIS — H2513 Age-related nuclear cataract, bilateral: Secondary | ICD-10-CM | POA: Diagnosis not present

## 2014-09-21 DIAGNOSIS — H524 Presbyopia: Secondary | ICD-10-CM | POA: Diagnosis not present

## 2014-09-21 DIAGNOSIS — H3531 Nonexudative age-related macular degeneration: Secondary | ICD-10-CM | POA: Diagnosis not present

## 2014-09-21 DIAGNOSIS — H04123 Dry eye syndrome of bilateral lacrimal glands: Secondary | ICD-10-CM | POA: Diagnosis not present

## 2014-10-04 DIAGNOSIS — H2512 Age-related nuclear cataract, left eye: Secondary | ICD-10-CM | POA: Diagnosis not present

## 2014-10-10 HISTORY — PX: EYE SURGERY: SHX253

## 2014-10-11 DIAGNOSIS — H2512 Age-related nuclear cataract, left eye: Secondary | ICD-10-CM | POA: Diagnosis not present

## 2014-10-11 DIAGNOSIS — H25812 Combined forms of age-related cataract, left eye: Secondary | ICD-10-CM | POA: Diagnosis not present

## 2014-10-18 DIAGNOSIS — Z86010 Personal history of colonic polyps: Secondary | ICD-10-CM | POA: Diagnosis not present

## 2014-10-18 DIAGNOSIS — D120 Benign neoplasm of cecum: Secondary | ICD-10-CM | POA: Diagnosis not present

## 2014-10-18 DIAGNOSIS — K640 First degree hemorrhoids: Secondary | ICD-10-CM | POA: Diagnosis not present

## 2014-10-18 DIAGNOSIS — D123 Benign neoplasm of transverse colon: Secondary | ICD-10-CM | POA: Diagnosis not present

## 2014-12-29 ENCOUNTER — Encounter: Payer: Self-pay | Admitting: Family

## 2015-01-02 ENCOUNTER — Other Ambulatory Visit: Payer: Self-pay | Admitting: *Deleted

## 2015-01-02 ENCOUNTER — Ambulatory Visit (INDEPENDENT_AMBULATORY_CARE_PROVIDER_SITE_OTHER): Payer: Commercial Managed Care - HMO | Admitting: Family

## 2015-01-02 ENCOUNTER — Encounter: Payer: Self-pay | Admitting: Family

## 2015-01-02 ENCOUNTER — Ambulatory Visit (HOSPITAL_COMMUNITY)
Admission: RE | Admit: 2015-01-02 | Discharge: 2015-01-02 | Disposition: A | Discharge status: Home / Self Care | Payer: Commercial Managed Care - HMO | Source: Ambulatory Visit | Attending: Vascular Surgery | Admitting: Vascular Surgery

## 2015-01-02 VITALS — BP 90/62 | HR 74 | Resp 16 | Ht 70.0 in | Wt 221.0 lb

## 2015-01-02 DIAGNOSIS — I6521 Occlusion and stenosis of right carotid artery: Secondary | ICD-10-CM

## 2015-01-02 DIAGNOSIS — I6523 Occlusion and stenosis of bilateral carotid arteries: Secondary | ICD-10-CM

## 2015-01-02 DIAGNOSIS — Z87891 Personal history of nicotine dependence: Secondary | ICD-10-CM | POA: Diagnosis not present

## 2015-01-02 DIAGNOSIS — I6522 Occlusion and stenosis of left carotid artery: Secondary | ICD-10-CM

## 2015-01-02 NOTE — Patient Instructions (Signed)
Stroke Prevention Some medical conditions and behaviors are associated with an increased chance of having a stroke. You may prevent a stroke by making healthy choices and managing medical conditions. HOW CAN I REDUCE MY RISK OF HAVING A STROKE?   Stay physically active. Get at least 30 minutes of activity on most or all days.  Do not smoke. It may also be helpful to avoid exposure to secondhand smoke.  Limit alcohol use. Moderate alcohol use is considered to be:  No more than 2 drinks per day for men.  No more than 1 drink per day for nonpregnant women.  Eat healthy foods. This involves:  Eating 5 or more servings of fruits and vegetables a day.  Making dietary changes that address high blood pressure (hypertension), high cholesterol, diabetes, or obesity.  Manage your cholesterol levels.  Making food choices that are high in fiber and low in saturated fat, trans fat, and cholesterol may control cholesterol levels.  Take any prescribed medicines to control cholesterol as directed by your health care provider.  Manage your diabetes.  Controlling your carbohydrate and sugar intake is recommended to manage diabetes.  Take any prescribed medicines to control diabetes as directed by your health care provider.  Control your hypertension.  Making food choices that are low in salt (sodium), saturated fat, trans fat, and cholesterol is recommended to manage hypertension.  Take any prescribed medicines to control hypertension as directed by your health care provider.  Maintain a healthy weight.  Reducing calorie intake and making food choices that are low in sodium, saturated fat, trans fat, and cholesterol are recommended to manage weight.  Stop drug abuse.  Avoid taking birth control pills.  Talk to your health care provider about the risks of taking birth control pills if you are over 35 years old, smoke, get migraines, or have ever had a blood clot.  Get evaluated for sleep  disorders (sleep apnea).  Talk to your health care provider about getting a sleep evaluation if you snore a lot or have excessive sleepiness.  Take medicines only as directed by your health care provider.  For some people, aspirin or blood thinners (anticoagulants) are helpful in reducing the risk of forming abnormal blood clots that can lead to stroke. If you have the irregular heart rhythm of atrial fibrillation, you should be on a blood thinner unless there is a good reason you cannot take them.  Understand all your medicine instructions.  Make sure that other conditions (such as anemia or atherosclerosis) are addressed. SEEK IMMEDIATE MEDICAL CARE IF:   You have sudden weakness or numbness of the face, arm, or leg, especially on one side of the body.  Your face or eyelid droops to one side.  You have sudden confusion.  You have trouble speaking (aphasia) or understanding.  You have sudden trouble seeing in one or both eyes.  You have sudden trouble walking.  You have dizziness.  You have a loss of balance or coordination.  You have a sudden, severe headache with no known cause.  You have new chest pain or an irregular heartbeat. Any of these symptoms may represent a serious problem that is an emergency. Do not wait to see if the symptoms will go away. Get medical help at once. Call your local emergency services (911 in [place] Do not drive yourself to the hospital. Document Released: 09/04/2004 Document Revised: 12/12/2013 Document Reviewed: 01/28/2013 Exit[employer] Patient Information 2015 [employer]. This information is not intended to replace advice given   to you by your health care provider. Make sure you discuss any questions you have with your health care provider.  

## 2015-01-02 NOTE — Progress Notes (Signed)
Established Carotid Patient   History of Present Illness  Caleb Barrett is a 80 y.o. male patient of Dr. Donnetta Hutching who returns today for followup of his extracranial cerebrovascular occlusive disease. Pt has a known left internal carotid artery occlusion; we are monitoring the right carotid artery stenosis.   Patient has not had previous carotid artery intervention. He had stroke about 2010 as manifested by inability to answer his wife and inability to get out of his chair for about 10 minutes, was watching TV. He denies any residual neurological deficits. He denies any amaurosis fugax, denies hemiparesis, denies unilateral facial drooping symptoms at the time of the stroke. He denies any further stroke or TIA since then.  Pt denies tingling, numbness, pain, or cold feeling in either upper extremity.  He denies non healing wounds.  The patient denies New Medical or Surgical History. Had lumbar spine surgery November 2014. He reports neuropathy in his legs that he attributes to DM.  Pt Diabetic: yes, states last A1C was 5.7 or 5.6, in excellent control Pt smoker: former smoker, quit about 1990, smoked for 40 years, denies secondhand exposure  Pt meds include: Statin : yes ASA: yes Other anticoagulants/antiplatelets: Plavix   Past Medical History  Diagnosis Date  . Hyperlipidemia   . Hypertension   . Neuropathy   . Gout   . Colon polyps   . Eczema   . Carotid bruit 12/29/11    carotid duplex: known occluded left ICA, mild disease at the right carotid bifurcation w/o significant stenosis  . Macular degeneration   . Cough syncope   . Diabetes mellitus without complication   . Peripheral vascular disease   . PONV (postoperative nausea and vomiting)   . Sleep apnea   . Stroke   . Headache(784.0)     seasonal headache  . Neuromuscular disorder     nerve damage in  feet    Social History History  Substance Use Topics  . Smoking status: Former Smoker -- 47 years    Quit  date: 09/18/1995  . Smokeless tobacco: Never Used  . Alcohol Use: No    Family History Family History  Problem Relation Age of Onset  . Thyroid disease Mother   . Heart disease Mother   . Diabetes Mother   . Cancer Mother   . Heart attack Mother   . Cancer Father     lung  . Hypertension Father   . Diabetes Father   . Cancer Brother     colon  . Heart disease Brother   . Cancer Daughter   . Cancer Son     Surgical History Past Surgical History  Procedure Laterality Date  . Umbilical hernia repair    . Shoulder surgery Left   . Cataract extraction Right 2008  . Trigger finger release    . Tonsillectomy    . Colonoscopy    . Back surgery  05/2013  . Spine surgery    . Eye surgery Right 2011     cataract  . Eye surgery Left March 2016    Cataract     Allergies  Allergen Reactions  . Penicillins   . Sulfa Antibiotics     Current Outpatient Prescriptions  Medication Sig Dispense Refill  . allopurinol (ZYLOPRIM) 300 MG tablet Take 300 mg by mouth daily.    Marland Kitchen aspirin 81 MG tablet Take 81 mg by mouth daily.    . clopidogrel (PLAVIX) 75 MG tablet Take 75 mg by mouth daily.    Marland Kitchen  fish oil-omega-3 fatty acids 1000 MG capsule Take 1 g by mouth daily.    Marland Kitchen gabapentin (NEURONTIN) 300 MG capsule Take 300 mg by mouth 2 (two) times daily.    Marland Kitchen gabapentin (NEURONTIN) 600 MG tablet 2 (two) times daily. Take 4 tablets twice daily    . lisinopril-hydrochlorothiazide (PRINZIDE,ZESTORETIC) 20-12.5 MG per tablet Take 2 tablets by mouth daily.    . metFORMIN (GLUCOPHAGE) 500 MG tablet Take 500 mg by mouth daily.    . Multiple Vitamins-Minerals (PRESERVISION AREDS PO) Take 1 tablet by mouth 2 (two) times daily.    . pravastatin (PRAVACHOL) 40 MG tablet Take 40 mg by mouth daily.    . TRAVATAN Z 0.004 % SOLN ophthalmic solution at bedtime.     No current facility-administered medications for this visit.    Review of Systems : See HPI for pertinent positives and  negatives.  Physical Examination  Filed Vitals:   01/02/15 1058 01/02/15 1101  BP: 126/81 90/62  Pulse: 74 74  Resp:  16  Height:  '5\' 10"'$  (1.778 m)  Weight:  221 lb (100.245 kg)  SpO2:  96%   Body mass index is 31.71 kg/(m^2).  General: WDWN obese male in NAD GAIT: normal Eyes: PERRLA Pulmonary:  Non-labored, CTAB, Negative  Rales, Negative rhonchi, & Negative wheezing.  Cardiac: regular Rhythm, no detected murmur.  VASCULAR EXAM Carotid Bruits Right Left   Negative Positive    Aorta is not palpable. Radial pulses: 2+ right, left not palpable, left brachial is 1+ palpable.                                                                                                                           LE Pulses Right Left       POPLITEAL  not palpable   not palpable       POSTERIOR TIBIAL   palpable    palpable        DORSALIS PEDIS      ANTERIOR TIBIAL  palpable   palpable     Gastrointestinal: soft, nontender, BS WNL, no r/g,  no palpable masses.  Musculoskeletal: Negative muscle atrophy/wasting. M/S 5/5 throughout, Extremities without ischemic changes.  Neurologic: A&O X 3; Appropriate Affect, Speech is normal CN 2-12 intact, Pain and light touch intact in extremities, Motor exam as listed above.   Non-Invasive Vascular Imaging CAROTID DUPLEX 01/02/2015   CEREBROVASCULAR DUPLEX EVALUATION    INDICATION: Carotid disease    PREVIOUS INTERVENTION(S):     DUPLEX EXAM:     RIGHT  LEFT  Peak Systolic Velocities (cm/s) End Diastolic Velocities (cm/s) Plaque LOCATION Peak Systolic Velocities (cm/s) End Diastolic Velocities (cm/s) Plaque  370 149 HM CCA PROXIMAL 88 15   74 31 HM CCA MID 124 25 HT  80 28 HM CCA DISTAL 122 22 HT  85 0 CP ECA 259 39 HT  82 26 CP ICA PROXIMAL 78 0 CP  86 42  ICA MID No Flow  HT  52 24  ICA DISTAL No Flow  HT    Not Calculated ICA / CCA Ratio (PSV) Not Calculated  Antegrade Vertebral Flow Not Visualized  680 Brachial Systolic  Pressure (mmHg) 98  Multiphasic (subclavian artery) Brachial Artery Waveforms Monophasic (subclavian artery)    Plaque Morphology:  HM = Homogeneous, HT = Heterogeneous, CP = Calcific Plaque, SP = Smooth Plaque, IP = Irregular Plaque     ADDITIONAL FINDINGS: . Resistive, biphasic flow noted in the right external carotid artery. . Greater than 50% left proximal external carotid artery stenosis noted. . Velocity of 426cm/s noted in the left proximal subclavian artery. . Greater than 28m/Mg difference in the bilateral brachial pressures.    IMPRESSION: 1. Doppler velocities suggest a 1-49% right proximal internal carotid artery stenosis and a greater than 50% stenosis near the right common carotid artery origin. 2. No flow was adequately detected in the left mid to distal internal carotid artery which suggests total-occlusion. 3. Evidence of left subclavian artery disease noted, as described above.    Compared to the previous exam:  No significant change noted when compared to the previous exam on 12/27/13.       Assessment: Caleb FULLILOVEis a 80y.o. male who had a stroke in 2010, has no residual neurological deficits, has had no stroke or TIA since then. Today's carotid Duplex suggests 1-49% right proximal internal carotid artery stenosis and a greater than 50% stenosis near the right common carotid artery origin. No flow was adequately detected in the left mid to distal internal carotid artery which suggests total-occlusion. Evidence of left subclavian artery disease noted, as described above. No significant change noted when compared to the previous exam on 12/27/13.     Plan: Follow-up in 1 year with Carotid Duplex.   I discussed in depth with the patient the nature of atherosclerosis, and emphasized the importance of maximal medical management including strict control of blood pressure, blood glucose, and lipid levels, obtaining regular exercise, and continued cessation of  smoking.  The patient is aware that without maximal medical management the underlying atherosclerotic disease process will progress, limiting the benefit of any interventions. The patient was given information about stroke prevention and what symptoms should prompt the patient to seek immediate medical care. Thank you for allowing [facility] participate in this patient's care.  SClemon Chambers RN, MSN, FNP-C [facility] GWellingtonOffice: [phone] Clinic Physician: Early  01/02/2015 11:27 AM

## 2015-01-17 DIAGNOSIS — I10 Essential (primary) hypertension: Secondary | ICD-10-CM | POA: Diagnosis not present

## 2015-01-17 DIAGNOSIS — E1140 Type 2 diabetes mellitus with diabetic neuropathy, unspecified: Secondary | ICD-10-CM | POA: Diagnosis not present

## 2015-01-17 DIAGNOSIS — E782 Mixed hyperlipidemia: Secondary | ICD-10-CM | POA: Diagnosis not present

## 2015-01-17 DIAGNOSIS — Z23 Encounter for immunization: Secondary | ICD-10-CM | POA: Diagnosis not present

## 2015-01-17 DIAGNOSIS — G629 Polyneuropathy, unspecified: Secondary | ICD-10-CM | POA: Diagnosis not present

## 2015-04-24 ENCOUNTER — Ambulatory Visit (HOSPITAL_BASED_OUTPATIENT_CLINIC_OR_DEPARTMENT_OTHER)
Admission: RE | Admit: 2015-04-24 | Discharge: 2015-04-24 | Disposition: A | Discharge status: Home / Self Care | Payer: Commercial Managed Care - HMO | Source: Ambulatory Visit | Attending: Family Medicine | Admitting: Family Medicine

## 2015-04-24 ENCOUNTER — Encounter (HOSPITAL_BASED_OUTPATIENT_CLINIC_OR_DEPARTMENT_OTHER): Payer: Self-pay

## 2015-04-24 ENCOUNTER — Other Ambulatory Visit (HOSPITAL_BASED_OUTPATIENT_CLINIC_OR_DEPARTMENT_OTHER): Payer: Self-pay | Admitting: Family Medicine

## 2015-04-24 ENCOUNTER — Other Ambulatory Visit: Payer: Self-pay | Admitting: Family Medicine

## 2015-04-24 ENCOUNTER — Ambulatory Visit (INDEPENDENT_AMBULATORY_CARE_PROVIDER_SITE_OTHER): Payer: Commercial Managed Care - HMO

## 2015-04-24 DIAGNOSIS — R918 Other nonspecific abnormal finding of lung field: Secondary | ICD-10-CM | POA: Diagnosis not present

## 2015-04-24 DIAGNOSIS — R0789 Other chest pain: Secondary | ICD-10-CM | POA: Diagnosis not present

## 2015-04-24 DIAGNOSIS — R059 Cough, unspecified: Secondary | ICD-10-CM

## 2015-04-24 DIAGNOSIS — R05 Cough: Secondary | ICD-10-CM | POA: Diagnosis not present

## 2015-04-24 DIAGNOSIS — R0602 Shortness of breath: Secondary | ICD-10-CM | POA: Diagnosis not present

## 2015-04-24 DIAGNOSIS — J9819 Other pulmonary collapse: Secondary | ICD-10-CM | POA: Diagnosis not present

## 2015-04-24 DIAGNOSIS — J9801 Acute bronchospasm: Secondary | ICD-10-CM | POA: Diagnosis not present

## 2015-04-24 DIAGNOSIS — J90 Pleural effusion, not elsewhere classified: Secondary | ICD-10-CM

## 2015-04-24 MED ORDER — IOHEXOL 300 MG/ML  SOLN
75.0000 mL | Freq: Once | INTRAMUSCULAR | Status: AC | PRN
  Administered 2015-04-24: 75 mL via INTRAVENOUS

## 2015-04-26 DIAGNOSIS — R918 Other nonspecific abnormal finding of lung field: Secondary | ICD-10-CM | POA: Diagnosis not present

## 2015-04-26 DIAGNOSIS — R062 Wheezing: Secondary | ICD-10-CM | POA: Diagnosis not present

## 2015-04-26 DIAGNOSIS — Z23 Encounter for immunization: Secondary | ICD-10-CM | POA: Diagnosis not present

## 2015-04-26 DIAGNOSIS — R05 Cough: Secondary | ICD-10-CM | POA: Diagnosis not present

## 2015-05-01 DIAGNOSIS — Z23 Encounter for immunization: Secondary | ICD-10-CM | POA: Diagnosis not present

## 2015-05-09 DIAGNOSIS — I10 Essential (primary) hypertension: Secondary | ICD-10-CM | POA: Diagnosis not present

## 2015-05-09 DIAGNOSIS — E119 Type 2 diabetes mellitus without complications: Secondary | ICD-10-CM | POA: Diagnosis not present

## 2015-05-09 DIAGNOSIS — Z8673 Personal history of transient ischemic attack (TIA), and cerebral infarction without residual deficits: Secondary | ICD-10-CM | POA: Diagnosis not present

## 2015-05-09 DIAGNOSIS — Z880 Allergy status to penicillin: Secondary | ICD-10-CM | POA: Diagnosis not present

## 2015-05-09 DIAGNOSIS — R590 Localized enlarged lymph nodes: Secondary | ICD-10-CM | POA: Diagnosis not present

## 2015-05-09 DIAGNOSIS — Z7951 Long term (current) use of inhaled steroids: Secondary | ICD-10-CM | POA: Diagnosis not present

## 2015-05-09 DIAGNOSIS — C3412 Malignant neoplasm of upper lobe, left bronchus or lung: Secondary | ICD-10-CM | POA: Diagnosis not present

## 2015-05-09 DIAGNOSIS — Z87891 Personal history of nicotine dependence: Secondary | ICD-10-CM | POA: Diagnosis not present

## 2015-05-09 DIAGNOSIS — Z882 Allergy status to sulfonamides status: Secondary | ICD-10-CM | POA: Diagnosis not present

## 2015-05-09 DIAGNOSIS — R918 Other nonspecific abnormal finding of lung field: Secondary | ICD-10-CM | POA: Diagnosis not present

## 2015-05-09 DIAGNOSIS — C3492 Malignant neoplasm of unspecified part of left bronchus or lung: Secondary | ICD-10-CM | POA: Diagnosis not present

## 2015-05-14 DIAGNOSIS — E279 Disorder of adrenal gland, unspecified: Secondary | ICD-10-CM | POA: Diagnosis not present

## 2015-05-14 DIAGNOSIS — I638 Other cerebral infarction: Secondary | ICD-10-CM | POA: Diagnosis not present

## 2015-05-14 DIAGNOSIS — R918 Other nonspecific abnormal finding of lung field: Secondary | ICD-10-CM | POA: Diagnosis not present

## 2015-05-14 DIAGNOSIS — I63512 Cerebral infarction due to unspecified occlusion or stenosis of left middle cerebral artery: Secondary | ICD-10-CM | POA: Diagnosis not present

## 2015-05-14 DIAGNOSIS — R591 Generalized enlarged lymph nodes: Secondary | ICD-10-CM | POA: Diagnosis not present

## 2015-05-14 DIAGNOSIS — I6781 Acute cerebrovascular insufficiency: Secondary | ICD-10-CM | POA: Diagnosis not present

## 2015-05-14 DIAGNOSIS — E278 Other specified disorders of adrenal gland: Secondary | ICD-10-CM | POA: Diagnosis not present

## 2015-05-18 DIAGNOSIS — Z801 Family history of malignant neoplasm of trachea, bronchus and lung: Secondary | ICD-10-CM | POA: Diagnosis not present

## 2015-05-18 DIAGNOSIS — Z5111 Encounter for antineoplastic chemotherapy: Secondary | ICD-10-CM | POA: Diagnosis not present

## 2015-05-18 DIAGNOSIS — Z87891 Personal history of nicotine dependence: Secondary | ICD-10-CM | POA: Diagnosis not present

## 2015-05-18 DIAGNOSIS — C3412 Malignant neoplasm of upper lobe, left bronchus or lung: Secondary | ICD-10-CM | POA: Diagnosis not present

## 2015-05-18 DIAGNOSIS — Z8673 Personal history of transient ischemic attack (TIA), and cerebral infarction without residual deficits: Secondary | ICD-10-CM | POA: Diagnosis not present

## 2015-05-25 DIAGNOSIS — Z8673 Personal history of transient ischemic attack (TIA), and cerebral infarction without residual deficits: Secondary | ICD-10-CM | POA: Diagnosis not present

## 2015-05-25 DIAGNOSIS — Z5111 Encounter for antineoplastic chemotherapy: Secondary | ICD-10-CM | POA: Diagnosis not present

## 2015-05-25 DIAGNOSIS — Z801 Family history of malignant neoplasm of trachea, bronchus and lung: Secondary | ICD-10-CM | POA: Diagnosis not present

## 2015-05-25 DIAGNOSIS — C3412 Malignant neoplasm of upper lobe, left bronchus or lung: Secondary | ICD-10-CM | POA: Diagnosis not present

## 2015-05-25 DIAGNOSIS — Z87891 Personal history of nicotine dependence: Secondary | ICD-10-CM | POA: Diagnosis not present

## 2015-05-31 DIAGNOSIS — J90 Pleural effusion, not elsewhere classified: Secondary | ICD-10-CM | POA: Diagnosis not present

## 2015-05-31 DIAGNOSIS — I10 Essential (primary) hypertension: Secondary | ICD-10-CM | POA: Diagnosis not present

## 2015-05-31 DIAGNOSIS — Z7984 Long term (current) use of oral hypoglycemic drugs: Secondary | ICD-10-CM | POA: Diagnosis not present

## 2015-05-31 DIAGNOSIS — Z79899 Other long term (current) drug therapy: Secondary | ICD-10-CM | POA: Diagnosis not present

## 2015-05-31 DIAGNOSIS — Z7951 Long term (current) use of inhaled steroids: Secondary | ICD-10-CM | POA: Diagnosis not present

## 2015-05-31 DIAGNOSIS — C3412 Malignant neoplasm of upper lobe, left bronchus or lung: Secondary | ICD-10-CM | POA: Diagnosis not present

## 2015-05-31 DIAGNOSIS — Z7982 Long term (current) use of aspirin: Secondary | ICD-10-CM | POA: Diagnosis not present

## 2015-05-31 DIAGNOSIS — Z7902 Long term (current) use of antithrombotics/antiplatelets: Secondary | ICD-10-CM | POA: Diagnosis not present

## 2015-05-31 DIAGNOSIS — E119 Type 2 diabetes mellitus without complications: Secondary | ICD-10-CM | POA: Diagnosis not present

## 2015-05-31 DIAGNOSIS — Z452 Encounter for adjustment and management of vascular access device: Secondary | ICD-10-CM | POA: Diagnosis not present

## 2015-05-31 DIAGNOSIS — Z87891 Personal history of nicotine dependence: Secondary | ICD-10-CM | POA: Diagnosis not present

## 2015-05-31 DIAGNOSIS — R918 Other nonspecific abnormal finding of lung field: Secondary | ICD-10-CM | POA: Diagnosis not present

## 2015-05-31 DIAGNOSIS — C3492 Malignant neoplasm of unspecified part of left bronchus or lung: Secondary | ICD-10-CM | POA: Diagnosis not present

## 2015-06-01 DIAGNOSIS — Z87891 Personal history of nicotine dependence: Secondary | ICD-10-CM | POA: Diagnosis not present

## 2015-06-01 DIAGNOSIS — Z5111 Encounter for antineoplastic chemotherapy: Secondary | ICD-10-CM | POA: Diagnosis not present

## 2015-06-01 DIAGNOSIS — C3412 Malignant neoplasm of upper lobe, left bronchus or lung: Secondary | ICD-10-CM | POA: Diagnosis not present

## 2015-06-06 DIAGNOSIS — Z87891 Personal history of nicotine dependence: Secondary | ICD-10-CM | POA: Diagnosis not present

## 2015-06-06 DIAGNOSIS — Z5111 Encounter for antineoplastic chemotherapy: Secondary | ICD-10-CM | POA: Diagnosis not present

## 2015-06-06 DIAGNOSIS — C3412 Malignant neoplasm of upper lobe, left bronchus or lung: Secondary | ICD-10-CM | POA: Diagnosis not present

## 2015-06-07 DIAGNOSIS — Z5111 Encounter for antineoplastic chemotherapy: Secondary | ICD-10-CM | POA: Diagnosis not present

## 2015-06-07 DIAGNOSIS — Z87891 Personal history of nicotine dependence: Secondary | ICD-10-CM | POA: Diagnosis not present

## 2015-06-07 DIAGNOSIS — C3412 Malignant neoplasm of upper lobe, left bronchus or lung: Secondary | ICD-10-CM | POA: Diagnosis not present

## 2015-06-08 DIAGNOSIS — R918 Other nonspecific abnormal finding of lung field: Secondary | ICD-10-CM | POA: Diagnosis not present

## 2015-06-14 DIAGNOSIS — C3412 Malignant neoplasm of upper lobe, left bronchus or lung: Secondary | ICD-10-CM | POA: Diagnosis not present

## 2015-06-14 DIAGNOSIS — Z5111 Encounter for antineoplastic chemotherapy: Secondary | ICD-10-CM | POA: Diagnosis not present

## 2015-06-21 DIAGNOSIS — C3412 Malignant neoplasm of upper lobe, left bronchus or lung: Secondary | ICD-10-CM | POA: Diagnosis not present

## 2015-06-21 DIAGNOSIS — Z5111 Encounter for antineoplastic chemotherapy: Secondary | ICD-10-CM | POA: Diagnosis not present

## 2015-06-26 DIAGNOSIS — H401131 Primary open-angle glaucoma, bilateral, mild stage: Secondary | ICD-10-CM | POA: Diagnosis not present

## 2015-06-26 DIAGNOSIS — H353132 Nonexudative age-related macular degeneration, bilateral, intermediate dry stage: Secondary | ICD-10-CM | POA: Diagnosis not present

## 2015-06-26 DIAGNOSIS — H43821 Vitreomacular adhesion, right eye: Secondary | ICD-10-CM | POA: Diagnosis not present

## 2015-06-29 DIAGNOSIS — R0602 Shortness of breath: Secondary | ICD-10-CM | POA: Diagnosis not present

## 2015-06-29 DIAGNOSIS — E1140 Type 2 diabetes mellitus with diabetic neuropathy, unspecified: Secondary | ICD-10-CM | POA: Diagnosis not present

## 2015-06-29 DIAGNOSIS — R05 Cough: Secondary | ICD-10-CM | POA: Diagnosis not present

## 2015-06-29 DIAGNOSIS — Z5111 Encounter for antineoplastic chemotherapy: Secondary | ICD-10-CM | POA: Diagnosis not present

## 2015-06-29 DIAGNOSIS — Z5181 Encounter for therapeutic drug level monitoring: Secondary | ICD-10-CM | POA: Diagnosis not present

## 2015-06-29 DIAGNOSIS — Z006 Encounter for examination for normal comparison and control in clinical research program: Secondary | ICD-10-CM | POA: Diagnosis not present

## 2015-06-29 DIAGNOSIS — C3412 Malignant neoplasm of upper lobe, left bronchus or lung: Secondary | ICD-10-CM | POA: Diagnosis not present

## 2015-06-29 DIAGNOSIS — Z79899 Other long term (current) drug therapy: Secondary | ICD-10-CM | POA: Diagnosis not present

## 2015-07-09 DIAGNOSIS — R918 Other nonspecific abnormal finding of lung field: Secondary | ICD-10-CM | POA: Diagnosis not present

## 2015-07-09 DIAGNOSIS — C3412 Malignant neoplasm of upper lobe, left bronchus or lung: Secondary | ICD-10-CM | POA: Diagnosis not present

## 2015-07-09 DIAGNOSIS — Z5111 Encounter for antineoplastic chemotherapy: Secondary | ICD-10-CM | POA: Diagnosis not present

## 2015-07-11 DIAGNOSIS — H35372 Puckering of macula, left eye: Secondary | ICD-10-CM | POA: Diagnosis not present

## 2015-07-11 DIAGNOSIS — H353132 Nonexudative age-related macular degeneration, bilateral, intermediate dry stage: Secondary | ICD-10-CM | POA: Diagnosis not present

## 2015-07-11 DIAGNOSIS — H35443 Age-related reticular degeneration of retina, bilateral: Secondary | ICD-10-CM | POA: Diagnosis not present

## 2015-07-11 DIAGNOSIS — H401131 Primary open-angle glaucoma, bilateral, mild stage: Secondary | ICD-10-CM | POA: Diagnosis not present

## 2015-07-11 DIAGNOSIS — E119 Type 2 diabetes mellitus without complications: Secondary | ICD-10-CM | POA: Diagnosis not present

## 2015-07-11 DIAGNOSIS — H43821 Vitreomacular adhesion, right eye: Secondary | ICD-10-CM | POA: Diagnosis not present

## 2015-07-11 DIAGNOSIS — Z961 Presence of intraocular lens: Secondary | ICD-10-CM | POA: Diagnosis not present

## 2015-07-11 DIAGNOSIS — H3122 Choroidal dystrophy (central areolar) (generalized) (peripapillary): Secondary | ICD-10-CM | POA: Diagnosis not present

## 2015-07-16 DIAGNOSIS — R590 Localized enlarged lymph nodes: Secondary | ICD-10-CM | POA: Diagnosis not present

## 2015-07-16 DIAGNOSIS — Z5111 Encounter for antineoplastic chemotherapy: Secondary | ICD-10-CM | POA: Diagnosis not present

## 2015-07-16 DIAGNOSIS — C3412 Malignant neoplasm of upper lobe, left bronchus or lung: Secondary | ICD-10-CM | POA: Diagnosis not present

## 2015-07-16 DIAGNOSIS — E278 Other specified disorders of adrenal gland: Secondary | ICD-10-CM | POA: Diagnosis not present

## 2015-07-23 DIAGNOSIS — C3490 Malignant neoplasm of unspecified part of unspecified bronchus or lung: Secondary | ICD-10-CM | POA: Diagnosis not present

## 2015-07-23 DIAGNOSIS — J90 Pleural effusion, not elsewhere classified: Secondary | ICD-10-CM | POA: Diagnosis not present

## 2015-07-23 DIAGNOSIS — D71 Functional disorders of polymorphonuclear neutrophils: Secondary | ICD-10-CM | POA: Diagnosis not present

## 2015-07-23 DIAGNOSIS — N400 Enlarged prostate without lower urinary tract symptoms: Secondary | ICD-10-CM | POA: Diagnosis not present

## 2015-07-23 DIAGNOSIS — J8410 Pulmonary fibrosis, unspecified: Secondary | ICD-10-CM | POA: Diagnosis not present

## 2015-08-01 DIAGNOSIS — R590 Localized enlarged lymph nodes: Secondary | ICD-10-CM | POA: Diagnosis not present

## 2015-08-01 DIAGNOSIS — Z5111 Encounter for antineoplastic chemotherapy: Secondary | ICD-10-CM | POA: Diagnosis not present

## 2015-08-01 DIAGNOSIS — C3412 Malignant neoplasm of upper lobe, left bronchus or lung: Secondary | ICD-10-CM | POA: Diagnosis not present

## 2015-08-01 DIAGNOSIS — E278 Other specified disorders of adrenal gland: Secondary | ICD-10-CM | POA: Diagnosis not present

## 2015-08-02 DIAGNOSIS — C3412 Malignant neoplasm of upper lobe, left bronchus or lung: Secondary | ICD-10-CM | POA: Diagnosis not present

## 2015-08-02 DIAGNOSIS — E278 Other specified disorders of adrenal gland: Secondary | ICD-10-CM | POA: Diagnosis not present

## 2015-08-02 DIAGNOSIS — R590 Localized enlarged lymph nodes: Secondary | ICD-10-CM | POA: Diagnosis not present

## 2015-08-02 DIAGNOSIS — R040 Epistaxis: Secondary | ICD-10-CM | POA: Diagnosis not present

## 2015-08-02 DIAGNOSIS — Z5111 Encounter for antineoplastic chemotherapy: Secondary | ICD-10-CM | POA: Diagnosis not present

## 2015-08-08 DIAGNOSIS — R918 Other nonspecific abnormal finding of lung field: Secondary | ICD-10-CM | POA: Diagnosis not present

## 2015-08-09 DIAGNOSIS — R590 Localized enlarged lymph nodes: Secondary | ICD-10-CM | POA: Diagnosis not present

## 2015-08-09 DIAGNOSIS — Z5111 Encounter for antineoplastic chemotherapy: Secondary | ICD-10-CM | POA: Diagnosis not present

## 2015-08-09 DIAGNOSIS — C3412 Malignant neoplasm of upper lobe, left bronchus or lung: Secondary | ICD-10-CM | POA: Diagnosis not present

## 2015-08-09 DIAGNOSIS — E278 Other specified disorders of adrenal gland: Secondary | ICD-10-CM | POA: Diagnosis not present

## 2015-08-16 DIAGNOSIS — C3412 Malignant neoplasm of upper lobe, left bronchus or lung: Secondary | ICD-10-CM | POA: Diagnosis not present

## 2015-08-16 DIAGNOSIS — Z5111 Encounter for antineoplastic chemotherapy: Secondary | ICD-10-CM | POA: Diagnosis not present

## 2015-08-20 DIAGNOSIS — C3412 Malignant neoplasm of upper lobe, left bronchus or lung: Secondary | ICD-10-CM | POA: Diagnosis not present

## 2015-08-21 DIAGNOSIS — Z0000 Encounter for general adult medical examination without abnormal findings: Secondary | ICD-10-CM | POA: Diagnosis not present

## 2015-08-21 DIAGNOSIS — J029 Acute pharyngitis, unspecified: Secondary | ICD-10-CM | POA: Diagnosis not present

## 2015-08-21 DIAGNOSIS — J3489 Other specified disorders of nose and nasal sinuses: Secondary | ICD-10-CM | POA: Diagnosis not present

## 2015-08-21 DIAGNOSIS — I10 Essential (primary) hypertension: Secondary | ICD-10-CM | POA: Diagnosis not present

## 2015-08-21 DIAGNOSIS — Z8673 Personal history of transient ischemic attack (TIA), and cerebral infarction without residual deficits: Secondary | ICD-10-CM | POA: Diagnosis not present

## 2015-08-21 DIAGNOSIS — E782 Mixed hyperlipidemia: Secondary | ICD-10-CM | POA: Diagnosis not present

## 2015-08-21 DIAGNOSIS — R5383 Other fatigue: Secondary | ICD-10-CM | POA: Diagnosis not present

## 2015-08-21 DIAGNOSIS — Z125 Encounter for screening for malignant neoplasm of prostate: Secondary | ICD-10-CM | POA: Diagnosis not present

## 2015-08-21 DIAGNOSIS — E1140 Type 2 diabetes mellitus with diabetic neuropathy, unspecified: Secondary | ICD-10-CM | POA: Diagnosis not present

## 2015-08-21 DIAGNOSIS — B2790 Infectious mononucleosis, unspecified without complication: Secondary | ICD-10-CM | POA: Diagnosis not present

## 2015-08-24 DIAGNOSIS — C3412 Malignant neoplasm of upper lobe, left bronchus or lung: Secondary | ICD-10-CM | POA: Diagnosis not present

## 2015-08-29 DIAGNOSIS — Z5111 Encounter for antineoplastic chemotherapy: Secondary | ICD-10-CM | POA: Diagnosis not present

## 2015-08-29 DIAGNOSIS — C3412 Malignant neoplasm of upper lobe, left bronchus or lung: Secondary | ICD-10-CM | POA: Diagnosis not present

## 2015-08-30 DIAGNOSIS — R590 Localized enlarged lymph nodes: Secondary | ICD-10-CM | POA: Diagnosis not present

## 2015-08-30 DIAGNOSIS — Z5111 Encounter for antineoplastic chemotherapy: Secondary | ICD-10-CM | POA: Diagnosis not present

## 2015-08-30 DIAGNOSIS — C7971 Secondary malignant neoplasm of right adrenal gland: Secondary | ICD-10-CM | POA: Diagnosis not present

## 2015-08-30 DIAGNOSIS — C3412 Malignant neoplasm of upper lobe, left bronchus or lung: Secondary | ICD-10-CM | POA: Diagnosis not present

## 2015-08-30 DIAGNOSIS — E1342 Other specified diabetes mellitus with diabetic polyneuropathy: Secondary | ICD-10-CM | POA: Diagnosis not present

## 2015-09-06 DIAGNOSIS — C3412 Malignant neoplasm of upper lobe, left bronchus or lung: Secondary | ICD-10-CM | POA: Diagnosis not present

## 2015-09-06 DIAGNOSIS — Z5111 Encounter for antineoplastic chemotherapy: Secondary | ICD-10-CM | POA: Diagnosis not present

## 2015-09-08 DIAGNOSIS — R918 Other nonspecific abnormal finding of lung field: Secondary | ICD-10-CM | POA: Diagnosis not present

## 2015-09-13 DIAGNOSIS — Z5111 Encounter for antineoplastic chemotherapy: Secondary | ICD-10-CM | POA: Diagnosis not present

## 2015-09-13 DIAGNOSIS — C3412 Malignant neoplasm of upper lobe, left bronchus or lung: Secondary | ICD-10-CM | POA: Diagnosis not present

## 2015-09-20 DIAGNOSIS — C3412 Malignant neoplasm of upper lobe, left bronchus or lung: Secondary | ICD-10-CM | POA: Diagnosis not present

## 2015-09-20 DIAGNOSIS — Z882 Allergy status to sulfonamides status: Secondary | ICD-10-CM | POA: Diagnosis not present

## 2015-09-20 DIAGNOSIS — Z880 Allergy status to penicillin: Secondary | ICD-10-CM | POA: Diagnosis not present

## 2015-09-26 DIAGNOSIS — C3412 Malignant neoplasm of upper lobe, left bronchus or lung: Secondary | ICD-10-CM | POA: Diagnosis not present

## 2015-09-26 DIAGNOSIS — Z5111 Encounter for antineoplastic chemotherapy: Secondary | ICD-10-CM | POA: Diagnosis not present

## 2015-09-27 DIAGNOSIS — Z5111 Encounter for antineoplastic chemotherapy: Secondary | ICD-10-CM | POA: Diagnosis not present

## 2015-09-27 DIAGNOSIS — C771 Secondary and unspecified malignant neoplasm of intrathoracic lymph nodes: Secondary | ICD-10-CM | POA: Diagnosis not present

## 2015-09-27 DIAGNOSIS — C3412 Malignant neoplasm of upper lobe, left bronchus or lung: Secondary | ICD-10-CM | POA: Diagnosis not present

## 2015-09-27 DIAGNOSIS — C7970 Secondary malignant neoplasm of unspecified adrenal gland: Secondary | ICD-10-CM | POA: Diagnosis not present

## 2015-10-01 DIAGNOSIS — C3412 Malignant neoplasm of upper lobe, left bronchus or lung: Secondary | ICD-10-CM | POA: Diagnosis not present

## 2015-10-02 DIAGNOSIS — Z510 Encounter for antineoplastic radiation therapy: Secondary | ICD-10-CM | POA: Diagnosis not present

## 2015-10-02 DIAGNOSIS — C3412 Malignant neoplasm of upper lobe, left bronchus or lung: Secondary | ICD-10-CM | POA: Diagnosis not present

## 2015-10-04 DIAGNOSIS — Z5111 Encounter for antineoplastic chemotherapy: Secondary | ICD-10-CM | POA: Diagnosis not present

## 2015-10-04 DIAGNOSIS — C3412 Malignant neoplasm of upper lobe, left bronchus or lung: Secondary | ICD-10-CM | POA: Diagnosis not present

## 2015-10-05 DIAGNOSIS — Z510 Encounter for antineoplastic radiation therapy: Secondary | ICD-10-CM | POA: Diagnosis not present

## 2015-10-05 DIAGNOSIS — C3412 Malignant neoplasm of upper lobe, left bronchus or lung: Secondary | ICD-10-CM | POA: Diagnosis not present

## 2015-10-08 DIAGNOSIS — C3412 Malignant neoplasm of upper lobe, left bronchus or lung: Secondary | ICD-10-CM | POA: Diagnosis not present

## 2015-10-08 DIAGNOSIS — Z510 Encounter for antineoplastic radiation therapy: Secondary | ICD-10-CM | POA: Diagnosis not present

## 2015-10-09 DIAGNOSIS — R918 Other nonspecific abnormal finding of lung field: Secondary | ICD-10-CM | POA: Diagnosis not present

## 2015-10-10 DIAGNOSIS — Z510 Encounter for antineoplastic radiation therapy: Secondary | ICD-10-CM | POA: Diagnosis not present

## 2015-10-10 DIAGNOSIS — C3412 Malignant neoplasm of upper lobe, left bronchus or lung: Secondary | ICD-10-CM | POA: Diagnosis not present

## 2015-10-11 DIAGNOSIS — C3412 Malignant neoplasm of upper lobe, left bronchus or lung: Secondary | ICD-10-CM | POA: Diagnosis not present

## 2015-10-11 DIAGNOSIS — Z510 Encounter for antineoplastic radiation therapy: Secondary | ICD-10-CM | POA: Diagnosis not present

## 2015-10-12 DIAGNOSIS — C3412 Malignant neoplasm of upper lobe, left bronchus or lung: Secondary | ICD-10-CM | POA: Diagnosis not present

## 2015-10-12 DIAGNOSIS — Z510 Encounter for antineoplastic radiation therapy: Secondary | ICD-10-CM | POA: Diagnosis not present

## 2015-10-15 DIAGNOSIS — C3412 Malignant neoplasm of upper lobe, left bronchus or lung: Secondary | ICD-10-CM | POA: Diagnosis not present

## 2015-10-15 DIAGNOSIS — Z510 Encounter for antineoplastic radiation therapy: Secondary | ICD-10-CM | POA: Diagnosis not present

## 2015-10-16 DIAGNOSIS — C3412 Malignant neoplasm of upper lobe, left bronchus or lung: Secondary | ICD-10-CM | POA: Diagnosis not present

## 2015-10-16 DIAGNOSIS — Z510 Encounter for antineoplastic radiation therapy: Secondary | ICD-10-CM | POA: Diagnosis not present

## 2015-10-17 DIAGNOSIS — Z510 Encounter for antineoplastic radiation therapy: Secondary | ICD-10-CM | POA: Diagnosis not present

## 2015-10-17 DIAGNOSIS — C3412 Malignant neoplasm of upper lobe, left bronchus or lung: Secondary | ICD-10-CM | POA: Diagnosis not present

## 2015-10-18 DIAGNOSIS — C3412 Malignant neoplasm of upper lobe, left bronchus or lung: Secondary | ICD-10-CM | POA: Diagnosis not present

## 2015-10-18 DIAGNOSIS — Z510 Encounter for antineoplastic radiation therapy: Secondary | ICD-10-CM | POA: Diagnosis not present

## 2015-10-19 DIAGNOSIS — Z510 Encounter for antineoplastic radiation therapy: Secondary | ICD-10-CM | POA: Diagnosis not present

## 2015-10-19 DIAGNOSIS — C3412 Malignant neoplasm of upper lobe, left bronchus or lung: Secondary | ICD-10-CM | POA: Diagnosis not present

## 2015-10-22 DIAGNOSIS — Z510 Encounter for antineoplastic radiation therapy: Secondary | ICD-10-CM | POA: Diagnosis not present

## 2015-10-22 DIAGNOSIS — C3412 Malignant neoplasm of upper lobe, left bronchus or lung: Secondary | ICD-10-CM | POA: Diagnosis not present

## 2015-10-23 DIAGNOSIS — Z510 Encounter for antineoplastic radiation therapy: Secondary | ICD-10-CM | POA: Diagnosis not present

## 2015-10-23 DIAGNOSIS — C3412 Malignant neoplasm of upper lobe, left bronchus or lung: Secondary | ICD-10-CM | POA: Diagnosis not present

## 2015-10-24 DIAGNOSIS — Z510 Encounter for antineoplastic radiation therapy: Secondary | ICD-10-CM | POA: Diagnosis not present

## 2015-10-24 DIAGNOSIS — C3412 Malignant neoplasm of upper lobe, left bronchus or lung: Secondary | ICD-10-CM | POA: Diagnosis not present

## 2015-10-25 DIAGNOSIS — Z510 Encounter for antineoplastic radiation therapy: Secondary | ICD-10-CM | POA: Diagnosis not present

## 2015-10-25 DIAGNOSIS — C3412 Malignant neoplasm of upper lobe, left bronchus or lung: Secondary | ICD-10-CM | POA: Diagnosis not present

## 2015-10-25 DIAGNOSIS — I9589 Other hypotension: Secondary | ICD-10-CM | POA: Diagnosis not present

## 2015-10-25 DIAGNOSIS — B370 Candidal stomatitis: Secondary | ICD-10-CM | POA: Diagnosis not present

## 2015-10-25 DIAGNOSIS — R5383 Other fatigue: Secondary | ICD-10-CM | POA: Diagnosis not present

## 2015-10-29 DIAGNOSIS — Z510 Encounter for antineoplastic radiation therapy: Secondary | ICD-10-CM | POA: Diagnosis not present

## 2015-10-29 DIAGNOSIS — C3412 Malignant neoplasm of upper lobe, left bronchus or lung: Secondary | ICD-10-CM | POA: Diagnosis not present

## 2015-10-30 DIAGNOSIS — Z510 Encounter for antineoplastic radiation therapy: Secondary | ICD-10-CM | POA: Diagnosis not present

## 2015-10-30 DIAGNOSIS — C3412 Malignant neoplasm of upper lobe, left bronchus or lung: Secondary | ICD-10-CM | POA: Diagnosis not present

## 2015-10-31 DIAGNOSIS — C3412 Malignant neoplasm of upper lobe, left bronchus or lung: Secondary | ICD-10-CM | POA: Diagnosis not present

## 2015-10-31 DIAGNOSIS — Z510 Encounter for antineoplastic radiation therapy: Secondary | ICD-10-CM | POA: Diagnosis not present

## 2015-11-01 DIAGNOSIS — Z510 Encounter for antineoplastic radiation therapy: Secondary | ICD-10-CM | POA: Diagnosis not present

## 2015-11-01 DIAGNOSIS — C3412 Malignant neoplasm of upper lobe, left bronchus or lung: Secondary | ICD-10-CM | POA: Diagnosis not present

## 2015-11-02 DIAGNOSIS — C3412 Malignant neoplasm of upper lobe, left bronchus or lung: Secondary | ICD-10-CM | POA: Diagnosis not present

## 2015-11-02 DIAGNOSIS — Z510 Encounter for antineoplastic radiation therapy: Secondary | ICD-10-CM | POA: Diagnosis not present

## 2015-11-05 DIAGNOSIS — Z510 Encounter for antineoplastic radiation therapy: Secondary | ICD-10-CM | POA: Diagnosis not present

## 2015-11-05 DIAGNOSIS — C3412 Malignant neoplasm of upper lobe, left bronchus or lung: Secondary | ICD-10-CM | POA: Diagnosis not present

## 2015-11-06 DIAGNOSIS — R918 Other nonspecific abnormal finding of lung field: Secondary | ICD-10-CM | POA: Diagnosis not present

## 2015-11-06 DIAGNOSIS — C3412 Malignant neoplasm of upper lobe, left bronchus or lung: Secondary | ICD-10-CM | POA: Diagnosis not present

## 2015-11-06 DIAGNOSIS — Z510 Encounter for antineoplastic radiation therapy: Secondary | ICD-10-CM | POA: Diagnosis not present

## 2015-11-07 DIAGNOSIS — Z510 Encounter for antineoplastic radiation therapy: Secondary | ICD-10-CM | POA: Diagnosis not present

## 2015-11-07 DIAGNOSIS — C3412 Malignant neoplasm of upper lobe, left bronchus or lung: Secondary | ICD-10-CM | POA: Diagnosis not present

## 2015-11-08 DIAGNOSIS — C3412 Malignant neoplasm of upper lobe, left bronchus or lung: Secondary | ICD-10-CM | POA: Diagnosis not present

## 2015-11-08 DIAGNOSIS — Z510 Encounter for antineoplastic radiation therapy: Secondary | ICD-10-CM | POA: Diagnosis not present

## 2015-11-09 DIAGNOSIS — C3412 Malignant neoplasm of upper lobe, left bronchus or lung: Secondary | ICD-10-CM | POA: Diagnosis not present

## 2015-11-09 DIAGNOSIS — Z510 Encounter for antineoplastic radiation therapy: Secondary | ICD-10-CM | POA: Diagnosis not present

## 2015-11-12 DIAGNOSIS — Z510 Encounter for antineoplastic radiation therapy: Secondary | ICD-10-CM | POA: Diagnosis not present

## 2015-11-12 DIAGNOSIS — C3412 Malignant neoplasm of upper lobe, left bronchus or lung: Secondary | ICD-10-CM | POA: Diagnosis not present

## 2015-11-13 DIAGNOSIS — Z510 Encounter for antineoplastic radiation therapy: Secondary | ICD-10-CM | POA: Diagnosis not present

## 2015-11-13 DIAGNOSIS — C3412 Malignant neoplasm of upper lobe, left bronchus or lung: Secondary | ICD-10-CM | POA: Diagnosis not present

## 2015-11-14 DIAGNOSIS — C3412 Malignant neoplasm of upper lobe, left bronchus or lung: Secondary | ICD-10-CM | POA: Diagnosis not present

## 2015-11-14 DIAGNOSIS — Z510 Encounter for antineoplastic radiation therapy: Secondary | ICD-10-CM | POA: Diagnosis not present

## 2015-11-14 DIAGNOSIS — R634 Abnormal weight loss: Secondary | ICD-10-CM | POA: Diagnosis not present

## 2015-11-15 DIAGNOSIS — K1233 Oral mucositis (ulcerative) due to radiation: Secondary | ICD-10-CM | POA: Diagnosis not present

## 2015-11-15 DIAGNOSIS — I10 Essential (primary) hypertension: Secondary | ICD-10-CM | POA: Diagnosis not present

## 2015-11-15 DIAGNOSIS — R634 Abnormal weight loss: Secondary | ICD-10-CM | POA: Diagnosis not present

## 2015-11-15 DIAGNOSIS — C3412 Malignant neoplasm of upper lobe, left bronchus or lung: Secondary | ICD-10-CM | POA: Diagnosis not present

## 2015-11-15 DIAGNOSIS — R1310 Dysphagia, unspecified: Secondary | ICD-10-CM | POA: Diagnosis not present

## 2015-11-15 DIAGNOSIS — M109 Gout, unspecified: Secondary | ICD-10-CM | POA: Diagnosis not present

## 2015-11-15 DIAGNOSIS — Z006 Encounter for examination for normal comparison and control in clinical research program: Secondary | ICD-10-CM | POA: Diagnosis not present

## 2015-11-15 DIAGNOSIS — Z7902 Long term (current) use of antithrombotics/antiplatelets: Secondary | ICD-10-CM | POA: Diagnosis not present

## 2015-11-15 DIAGNOSIS — Z87891 Personal history of nicotine dependence: Secondary | ICD-10-CM | POA: Diagnosis not present

## 2015-11-15 DIAGNOSIS — Z9981 Dependence on supplemental oxygen: Secondary | ICD-10-CM | POA: Diagnosis not present

## 2015-11-15 DIAGNOSIS — E119 Type 2 diabetes mellitus without complications: Secondary | ICD-10-CM | POA: Diagnosis not present

## 2015-11-16 DIAGNOSIS — E119 Type 2 diabetes mellitus without complications: Secondary | ICD-10-CM | POA: Diagnosis not present

## 2015-11-16 DIAGNOSIS — M109 Gout, unspecified: Secondary | ICD-10-CM | POA: Diagnosis not present

## 2015-11-16 DIAGNOSIS — Z9981 Dependence on supplemental oxygen: Secondary | ICD-10-CM | POA: Diagnosis not present

## 2015-11-16 DIAGNOSIS — Z7902 Long term (current) use of antithrombotics/antiplatelets: Secondary | ICD-10-CM | POA: Diagnosis not present

## 2015-11-16 DIAGNOSIS — I10 Essential (primary) hypertension: Secondary | ICD-10-CM | POA: Diagnosis not present

## 2015-11-16 DIAGNOSIS — Z87891 Personal history of nicotine dependence: Secondary | ICD-10-CM | POA: Diagnosis not present

## 2015-11-16 DIAGNOSIS — K1233 Oral mucositis (ulcerative) due to radiation: Secondary | ICD-10-CM | POA: Diagnosis not present

## 2015-11-16 DIAGNOSIS — R64 Cachexia: Secondary | ICD-10-CM | POA: Diagnosis not present

## 2015-11-16 DIAGNOSIS — C3412 Malignant neoplasm of upper lobe, left bronchus or lung: Secondary | ICD-10-CM | POA: Diagnosis not present

## 2015-11-16 DIAGNOSIS — Z006 Encounter for examination for normal comparison and control in clinical research program: Secondary | ICD-10-CM | POA: Diagnosis not present

## 2015-11-19 DIAGNOSIS — Z510 Encounter for antineoplastic radiation therapy: Secondary | ICD-10-CM | POA: Diagnosis not present

## 2015-11-19 DIAGNOSIS — C3412 Malignant neoplasm of upper lobe, left bronchus or lung: Secondary | ICD-10-CM | POA: Diagnosis not present

## 2015-11-20 DIAGNOSIS — C3412 Malignant neoplasm of upper lobe, left bronchus or lung: Secondary | ICD-10-CM | POA: Diagnosis not present

## 2015-11-20 DIAGNOSIS — Z510 Encounter for antineoplastic radiation therapy: Secondary | ICD-10-CM | POA: Diagnosis not present

## 2015-11-21 DIAGNOSIS — R1310 Dysphagia, unspecified: Secondary | ICD-10-CM | POA: Diagnosis not present

## 2015-11-21 DIAGNOSIS — I10 Essential (primary) hypertension: Secondary | ICD-10-CM | POA: Diagnosis not present

## 2015-11-21 DIAGNOSIS — C3490 Malignant neoplasm of unspecified part of unspecified bronchus or lung: Secondary | ICD-10-CM | POA: Diagnosis not present

## 2015-11-21 DIAGNOSIS — Y842 Radiological procedure and radiotherapy as the cause of abnormal reaction of the patient, or of later complication, without mention of misadventure at the time of the procedure: Secondary | ICD-10-CM | POA: Diagnosis not present

## 2015-11-21 DIAGNOSIS — C3412 Malignant neoplasm of upper lobe, left bronchus or lung: Secondary | ICD-10-CM | POA: Diagnosis not present

## 2015-11-21 DIAGNOSIS — E119 Type 2 diabetes mellitus without complications: Secondary | ICD-10-CM | POA: Diagnosis not present

## 2015-11-21 DIAGNOSIS — Z510 Encounter for antineoplastic radiation therapy: Secondary | ICD-10-CM | POA: Diagnosis not present

## 2015-11-21 DIAGNOSIS — M109 Gout, unspecified: Secondary | ICD-10-CM | POA: Diagnosis not present

## 2015-11-21 DIAGNOSIS — K208 Other esophagitis: Secondary | ICD-10-CM | POA: Diagnosis not present

## 2015-11-21 DIAGNOSIS — Z7984 Long term (current) use of oral hypoglycemic drugs: Secondary | ICD-10-CM | POA: Diagnosis not present

## 2015-11-21 DIAGNOSIS — Z431 Encounter for attention to gastrostomy: Secondary | ICD-10-CM | POA: Diagnosis not present

## 2015-11-22 DIAGNOSIS — R634 Abnormal weight loss: Secondary | ICD-10-CM | POA: Diagnosis not present

## 2015-11-22 DIAGNOSIS — C3412 Malignant neoplasm of upper lobe, left bronchus or lung: Secondary | ICD-10-CM | POA: Diagnosis not present

## 2015-11-23 DIAGNOSIS — R918 Other nonspecific abnormal finding of lung field: Secondary | ICD-10-CM | POA: Diagnosis not present

## 2015-11-27 DIAGNOSIS — M109 Gout, unspecified: Secondary | ICD-10-CM | POA: Diagnosis not present

## 2015-11-27 DIAGNOSIS — Z7984 Long term (current) use of oral hypoglycemic drugs: Secondary | ICD-10-CM | POA: Diagnosis not present

## 2015-11-27 DIAGNOSIS — H35443 Age-related reticular degeneration of retina, bilateral: Secondary | ICD-10-CM | POA: Diagnosis not present

## 2015-11-27 DIAGNOSIS — H3122 Choroidal dystrophy (central areolar) (generalized) (peripapillary): Secondary | ICD-10-CM | POA: Diagnosis not present

## 2015-11-27 DIAGNOSIS — E119 Type 2 diabetes mellitus without complications: Secondary | ICD-10-CM | POA: Diagnosis not present

## 2015-11-27 DIAGNOSIS — Z431 Encounter for attention to gastrostomy: Secondary | ICD-10-CM | POA: Diagnosis not present

## 2015-11-27 DIAGNOSIS — C3490 Malignant neoplasm of unspecified part of unspecified bronchus or lung: Secondary | ICD-10-CM | POA: Diagnosis not present

## 2015-11-27 DIAGNOSIS — H353132 Nonexudative age-related macular degeneration, bilateral, intermediate dry stage: Secondary | ICD-10-CM | POA: Diagnosis not present

## 2015-11-27 DIAGNOSIS — K208 Other esophagitis: Secondary | ICD-10-CM | POA: Diagnosis not present

## 2015-11-27 DIAGNOSIS — Y842 Radiological procedure and radiotherapy as the cause of abnormal reaction of the patient, or of later complication, without mention of misadventure at the time of the procedure: Secondary | ICD-10-CM | POA: Diagnosis not present

## 2015-11-27 DIAGNOSIS — R1310 Dysphagia, unspecified: Secondary | ICD-10-CM | POA: Diagnosis not present

## 2015-11-27 DIAGNOSIS — H401131 Primary open-angle glaucoma, bilateral, mild stage: Secondary | ICD-10-CM | POA: Diagnosis not present

## 2015-11-27 DIAGNOSIS — H43821 Vitreomacular adhesion, right eye: Secondary | ICD-10-CM | POA: Diagnosis not present

## 2015-11-27 DIAGNOSIS — Z961 Presence of intraocular lens: Secondary | ICD-10-CM | POA: Diagnosis not present

## 2015-11-27 DIAGNOSIS — H35372 Puckering of macula, left eye: Secondary | ICD-10-CM | POA: Diagnosis not present

## 2015-11-27 DIAGNOSIS — I10 Essential (primary) hypertension: Secondary | ICD-10-CM | POA: Diagnosis not present

## 2015-12-06 DIAGNOSIS — E119 Type 2 diabetes mellitus without complications: Secondary | ICD-10-CM | POA: Diagnosis not present

## 2015-12-06 DIAGNOSIS — C3490 Malignant neoplasm of unspecified part of unspecified bronchus or lung: Secondary | ICD-10-CM | POA: Diagnosis not present

## 2015-12-06 DIAGNOSIS — Z7984 Long term (current) use of oral hypoglycemic drugs: Secondary | ICD-10-CM | POA: Diagnosis not present

## 2015-12-06 DIAGNOSIS — Z431 Encounter for attention to gastrostomy: Secondary | ICD-10-CM | POA: Diagnosis not present

## 2015-12-06 DIAGNOSIS — I10 Essential (primary) hypertension: Secondary | ICD-10-CM | POA: Diagnosis not present

## 2015-12-06 DIAGNOSIS — M109 Gout, unspecified: Secondary | ICD-10-CM | POA: Diagnosis not present

## 2015-12-06 DIAGNOSIS — R1310 Dysphagia, unspecified: Secondary | ICD-10-CM | POA: Diagnosis not present

## 2015-12-06 DIAGNOSIS — R918 Other nonspecific abnormal finding of lung field: Secondary | ICD-10-CM | POA: Diagnosis not present

## 2015-12-06 DIAGNOSIS — K208 Other esophagitis: Secondary | ICD-10-CM | POA: Diagnosis not present

## 2015-12-06 DIAGNOSIS — Y842 Radiological procedure and radiotherapy as the cause of abnormal reaction of the patient, or of later complication, without mention of misadventure at the time of the procedure: Secondary | ICD-10-CM | POA: Diagnosis not present

## 2015-12-07 DIAGNOSIS — R918 Other nonspecific abnormal finding of lung field: Secondary | ICD-10-CM | POA: Diagnosis not present

## 2015-12-11 DIAGNOSIS — E119 Type 2 diabetes mellitus without complications: Secondary | ICD-10-CM | POA: Diagnosis not present

## 2015-12-11 DIAGNOSIS — I10 Essential (primary) hypertension: Secondary | ICD-10-CM | POA: Diagnosis not present

## 2015-12-11 DIAGNOSIS — C3490 Malignant neoplasm of unspecified part of unspecified bronchus or lung: Secondary | ICD-10-CM | POA: Diagnosis not present

## 2015-12-11 DIAGNOSIS — Y842 Radiological procedure and radiotherapy as the cause of abnormal reaction of the patient, or of later complication, without mention of misadventure at the time of the procedure: Secondary | ICD-10-CM | POA: Diagnosis not present

## 2015-12-11 DIAGNOSIS — K208 Other esophagitis: Secondary | ICD-10-CM | POA: Diagnosis not present

## 2015-12-11 DIAGNOSIS — Z7984 Long term (current) use of oral hypoglycemic drugs: Secondary | ICD-10-CM | POA: Diagnosis not present

## 2015-12-11 DIAGNOSIS — R1310 Dysphagia, unspecified: Secondary | ICD-10-CM | POA: Diagnosis not present

## 2015-12-11 DIAGNOSIS — Z431 Encounter for attention to gastrostomy: Secondary | ICD-10-CM | POA: Diagnosis not present

## 2015-12-11 DIAGNOSIS — M109 Gout, unspecified: Secondary | ICD-10-CM | POA: Diagnosis not present

## 2015-12-12 DIAGNOSIS — R1310 Dysphagia, unspecified: Secondary | ICD-10-CM | POA: Diagnosis not present

## 2015-12-12 DIAGNOSIS — Z7984 Long term (current) use of oral hypoglycemic drugs: Secondary | ICD-10-CM | POA: Diagnosis not present

## 2015-12-12 DIAGNOSIS — E119 Type 2 diabetes mellitus without complications: Secondary | ICD-10-CM | POA: Diagnosis not present

## 2015-12-12 DIAGNOSIS — I10 Essential (primary) hypertension: Secondary | ICD-10-CM | POA: Diagnosis not present

## 2015-12-12 DIAGNOSIS — Z431 Encounter for attention to gastrostomy: Secondary | ICD-10-CM | POA: Diagnosis not present

## 2015-12-12 DIAGNOSIS — Y842 Radiological procedure and radiotherapy as the cause of abnormal reaction of the patient, or of later complication, without mention of misadventure at the time of the procedure: Secondary | ICD-10-CM | POA: Diagnosis not present

## 2015-12-12 DIAGNOSIS — K208 Other esophagitis: Secondary | ICD-10-CM | POA: Diagnosis not present

## 2015-12-12 DIAGNOSIS — C3490 Malignant neoplasm of unspecified part of unspecified bronchus or lung: Secondary | ICD-10-CM | POA: Diagnosis not present

## 2015-12-12 DIAGNOSIS — M109 Gout, unspecified: Secondary | ICD-10-CM | POA: Diagnosis not present

## 2015-12-13 DIAGNOSIS — C3490 Malignant neoplasm of unspecified part of unspecified bronchus or lung: Secondary | ICD-10-CM | POA: Diagnosis not present

## 2015-12-13 DIAGNOSIS — Z431 Encounter for attention to gastrostomy: Secondary | ICD-10-CM | POA: Diagnosis not present

## 2015-12-13 DIAGNOSIS — I10 Essential (primary) hypertension: Secondary | ICD-10-CM | POA: Diagnosis not present

## 2015-12-13 DIAGNOSIS — M109 Gout, unspecified: Secondary | ICD-10-CM | POA: Diagnosis not present

## 2015-12-13 DIAGNOSIS — R1310 Dysphagia, unspecified: Secondary | ICD-10-CM | POA: Diagnosis not present

## 2015-12-13 DIAGNOSIS — K208 Other esophagitis: Secondary | ICD-10-CM | POA: Diagnosis not present

## 2015-12-13 DIAGNOSIS — Y842 Radiological procedure and radiotherapy as the cause of abnormal reaction of the patient, or of later complication, without mention of misadventure at the time of the procedure: Secondary | ICD-10-CM | POA: Diagnosis not present

## 2015-12-13 DIAGNOSIS — E119 Type 2 diabetes mellitus without complications: Secondary | ICD-10-CM | POA: Diagnosis not present

## 2015-12-13 DIAGNOSIS — Z7984 Long term (current) use of oral hypoglycemic drugs: Secondary | ICD-10-CM | POA: Diagnosis not present

## 2015-12-14 DIAGNOSIS — C3412 Malignant neoplasm of upper lobe, left bronchus or lung: Secondary | ICD-10-CM | POA: Diagnosis not present

## 2015-12-14 DIAGNOSIS — R1310 Dysphagia, unspecified: Secondary | ICD-10-CM | POA: Diagnosis not present

## 2015-12-16 DIAGNOSIS — R64 Cachexia: Secondary | ICD-10-CM | POA: Diagnosis not present

## 2015-12-18 DIAGNOSIS — C3412 Malignant neoplasm of upper lobe, left bronchus or lung: Secondary | ICD-10-CM | POA: Diagnosis not present

## 2015-12-18 DIAGNOSIS — M109 Gout, unspecified: Secondary | ICD-10-CM | POA: Diagnosis not present

## 2015-12-18 DIAGNOSIS — D696 Thrombocytopenia, unspecified: Secondary | ICD-10-CM | POA: Diagnosis not present

## 2015-12-18 DIAGNOSIS — T85598A Other mechanical complication of other gastrointestinal prosthetic devices, implants and grafts, initial encounter: Secondary | ICD-10-CM | POA: Diagnosis not present

## 2015-12-18 DIAGNOSIS — K208 Other esophagitis: Secondary | ICD-10-CM | POA: Diagnosis not present

## 2015-12-18 DIAGNOSIS — K8020 Calculus of gallbladder without cholecystitis without obstruction: Secondary | ICD-10-CM | POA: Diagnosis not present

## 2015-12-18 DIAGNOSIS — K838 Other specified diseases of biliary tract: Secondary | ICD-10-CM | POA: Diagnosis not present

## 2015-12-18 DIAGNOSIS — D649 Anemia, unspecified: Secondary | ICD-10-CM | POA: Diagnosis not present

## 2015-12-18 DIAGNOSIS — E119 Type 2 diabetes mellitus without complications: Secondary | ICD-10-CM | POA: Diagnosis not present

## 2015-12-18 DIAGNOSIS — Y842 Radiological procedure and radiotherapy as the cause of abnormal reaction of the patient, or of later complication, without mention of misadventure at the time of the procedure: Secondary | ICD-10-CM | POA: Diagnosis not present

## 2015-12-18 DIAGNOSIS — Z431 Encounter for attention to gastrostomy: Secondary | ICD-10-CM | POA: Diagnosis not present

## 2015-12-18 DIAGNOSIS — C3490 Malignant neoplasm of unspecified part of unspecified bronchus or lung: Secondary | ICD-10-CM | POA: Diagnosis not present

## 2015-12-18 DIAGNOSIS — Z7984 Long term (current) use of oral hypoglycemic drugs: Secondary | ICD-10-CM | POA: Diagnosis not present

## 2015-12-18 DIAGNOSIS — K8050 Calculus of bile duct without cholangitis or cholecystitis without obstruction: Secondary | ICD-10-CM | POA: Diagnosis not present

## 2015-12-18 DIAGNOSIS — R1310 Dysphagia, unspecified: Secondary | ICD-10-CM | POA: Diagnosis not present

## 2015-12-18 DIAGNOSIS — Z95828 Presence of other vascular implants and grafts: Secondary | ICD-10-CM | POA: Diagnosis not present

## 2015-12-18 DIAGNOSIS — I10 Essential (primary) hypertension: Secondary | ICD-10-CM | POA: Diagnosis not present

## 2015-12-19 DIAGNOSIS — K8050 Calculus of bile duct without cholangitis or cholecystitis without obstruction: Secondary | ICD-10-CM | POA: Diagnosis not present

## 2015-12-19 DIAGNOSIS — R748 Abnormal levels of other serum enzymes: Secondary | ICD-10-CM | POA: Diagnosis not present

## 2015-12-20 DIAGNOSIS — E119 Type 2 diabetes mellitus without complications: Secondary | ICD-10-CM | POA: Diagnosis not present

## 2015-12-20 DIAGNOSIS — R932 Abnormal findings on diagnostic imaging of liver and biliary tract: Secondary | ICD-10-CM | POA: Diagnosis not present

## 2015-12-20 DIAGNOSIS — Z7984 Long term (current) use of oral hypoglycemic drugs: Secondary | ICD-10-CM | POA: Diagnosis not present

## 2015-12-20 DIAGNOSIS — Z85118 Personal history of other malignant neoplasm of bronchus and lung: Secondary | ICD-10-CM | POA: Diagnosis not present

## 2015-12-20 DIAGNOSIS — K8050 Calculus of bile duct without cholangitis or cholecystitis without obstruction: Secondary | ICD-10-CM | POA: Diagnosis not present

## 2015-12-20 DIAGNOSIS — K8070 Calculus of gallbladder and bile duct without cholecystitis without obstruction: Secondary | ICD-10-CM | POA: Diagnosis not present

## 2015-12-20 DIAGNOSIS — I10 Essential (primary) hypertension: Secondary | ICD-10-CM | POA: Diagnosis not present

## 2015-12-20 DIAGNOSIS — Z8673 Personal history of transient ischemic attack (TIA), and cerebral infarction without residual deficits: Secondary | ICD-10-CM | POA: Diagnosis not present

## 2015-12-20 DIAGNOSIS — G4730 Sleep apnea, unspecified: Secondary | ICD-10-CM | POA: Diagnosis not present

## 2015-12-20 DIAGNOSIS — Z7902 Long term (current) use of antithrombotics/antiplatelets: Secondary | ICD-10-CM | POA: Diagnosis not present

## 2015-12-20 DIAGNOSIS — Z87891 Personal history of nicotine dependence: Secondary | ICD-10-CM | POA: Diagnosis not present

## 2015-12-25 DIAGNOSIS — C3490 Malignant neoplasm of unspecified part of unspecified bronchus or lung: Secondary | ICD-10-CM | POA: Diagnosis not present

## 2015-12-25 DIAGNOSIS — R1310 Dysphagia, unspecified: Secondary | ICD-10-CM | POA: Diagnosis not present

## 2015-12-25 DIAGNOSIS — I10 Essential (primary) hypertension: Secondary | ICD-10-CM | POA: Diagnosis not present

## 2015-12-25 DIAGNOSIS — Y842 Radiological procedure and radiotherapy as the cause of abnormal reaction of the patient, or of later complication, without mention of misadventure at the time of the procedure: Secondary | ICD-10-CM | POA: Diagnosis not present

## 2015-12-25 DIAGNOSIS — Z7984 Long term (current) use of oral hypoglycemic drugs: Secondary | ICD-10-CM | POA: Diagnosis not present

## 2015-12-25 DIAGNOSIS — Z431 Encounter for attention to gastrostomy: Secondary | ICD-10-CM | POA: Diagnosis not present

## 2015-12-25 DIAGNOSIS — K208 Other esophagitis: Secondary | ICD-10-CM | POA: Diagnosis not present

## 2015-12-25 DIAGNOSIS — M109 Gout, unspecified: Secondary | ICD-10-CM | POA: Diagnosis not present

## 2015-12-25 DIAGNOSIS — E119 Type 2 diabetes mellitus without complications: Secondary | ICD-10-CM | POA: Diagnosis not present

## 2015-12-27 DIAGNOSIS — E119 Type 2 diabetes mellitus without complications: Secondary | ICD-10-CM | POA: Diagnosis not present

## 2015-12-27 DIAGNOSIS — M109 Gout, unspecified: Secondary | ICD-10-CM | POA: Diagnosis not present

## 2015-12-27 DIAGNOSIS — Z7984 Long term (current) use of oral hypoglycemic drugs: Secondary | ICD-10-CM | POA: Diagnosis not present

## 2015-12-27 DIAGNOSIS — R1310 Dysphagia, unspecified: Secondary | ICD-10-CM | POA: Diagnosis not present

## 2015-12-27 DIAGNOSIS — I10 Essential (primary) hypertension: Secondary | ICD-10-CM | POA: Diagnosis not present

## 2015-12-27 DIAGNOSIS — Z431 Encounter for attention to gastrostomy: Secondary | ICD-10-CM | POA: Diagnosis not present

## 2015-12-27 DIAGNOSIS — K208 Other esophagitis: Secondary | ICD-10-CM | POA: Diagnosis not present

## 2015-12-27 DIAGNOSIS — Y842 Radiological procedure and radiotherapy as the cause of abnormal reaction of the patient, or of later complication, without mention of misadventure at the time of the procedure: Secondary | ICD-10-CM | POA: Diagnosis not present

## 2015-12-27 DIAGNOSIS — C3490 Malignant neoplasm of unspecified part of unspecified bronchus or lung: Secondary | ICD-10-CM | POA: Diagnosis not present

## 2015-12-31 DIAGNOSIS — Y842 Radiological procedure and radiotherapy as the cause of abnormal reaction of the patient, or of later complication, without mention of misadventure at the time of the procedure: Secondary | ICD-10-CM | POA: Diagnosis not present

## 2015-12-31 DIAGNOSIS — R1310 Dysphagia, unspecified: Secondary | ICD-10-CM | POA: Diagnosis not present

## 2015-12-31 DIAGNOSIS — E119 Type 2 diabetes mellitus without complications: Secondary | ICD-10-CM | POA: Diagnosis not present

## 2015-12-31 DIAGNOSIS — K208 Other esophagitis: Secondary | ICD-10-CM | POA: Diagnosis not present

## 2015-12-31 DIAGNOSIS — Z7984 Long term (current) use of oral hypoglycemic drugs: Secondary | ICD-10-CM | POA: Diagnosis not present

## 2015-12-31 DIAGNOSIS — C3490 Malignant neoplasm of unspecified part of unspecified bronchus or lung: Secondary | ICD-10-CM | POA: Diagnosis not present

## 2015-12-31 DIAGNOSIS — Z431 Encounter for attention to gastrostomy: Secondary | ICD-10-CM | POA: Diagnosis not present

## 2015-12-31 DIAGNOSIS — I10 Essential (primary) hypertension: Secondary | ICD-10-CM | POA: Diagnosis not present

## 2015-12-31 DIAGNOSIS — M109 Gout, unspecified: Secondary | ICD-10-CM | POA: Diagnosis not present

## 2016-01-02 DIAGNOSIS — G629 Polyneuropathy, unspecified: Secondary | ICD-10-CM | POA: Diagnosis not present

## 2016-01-02 DIAGNOSIS — I10 Essential (primary) hypertension: Secondary | ICD-10-CM | POA: Diagnosis not present

## 2016-01-02 DIAGNOSIS — E1140 Type 2 diabetes mellitus with diabetic neuropathy, unspecified: Secondary | ICD-10-CM | POA: Diagnosis not present

## 2016-01-02 DIAGNOSIS — E782 Mixed hyperlipidemia: Secondary | ICD-10-CM | POA: Diagnosis not present

## 2016-01-04 DIAGNOSIS — T85598A Other mechanical complication of other gastrointestinal prosthetic devices, implants and grafts, initial encounter: Secondary | ICD-10-CM | POA: Diagnosis not present

## 2016-01-04 DIAGNOSIS — D696 Thrombocytopenia, unspecified: Secondary | ICD-10-CM | POA: Diagnosis not present

## 2016-01-04 DIAGNOSIS — I10 Essential (primary) hypertension: Secondary | ICD-10-CM | POA: Diagnosis not present

## 2016-01-04 DIAGNOSIS — Z7984 Long term (current) use of oral hypoglycemic drugs: Secondary | ICD-10-CM | POA: Diagnosis not present

## 2016-01-04 DIAGNOSIS — K8020 Calculus of gallbladder without cholecystitis without obstruction: Secondary | ICD-10-CM | POA: Diagnosis not present

## 2016-01-04 DIAGNOSIS — Z95828 Presence of other vascular implants and grafts: Secondary | ICD-10-CM | POA: Diagnosis not present

## 2016-01-04 DIAGNOSIS — E119 Type 2 diabetes mellitus without complications: Secondary | ICD-10-CM | POA: Diagnosis not present

## 2016-01-04 DIAGNOSIS — Z431 Encounter for attention to gastrostomy: Secondary | ICD-10-CM | POA: Diagnosis not present

## 2016-01-04 DIAGNOSIS — K208 Other esophagitis: Secondary | ICD-10-CM | POA: Diagnosis not present

## 2016-01-04 DIAGNOSIS — R1310 Dysphagia, unspecified: Secondary | ICD-10-CM | POA: Diagnosis not present

## 2016-01-04 DIAGNOSIS — C3412 Malignant neoplasm of upper lobe, left bronchus or lung: Secondary | ICD-10-CM | POA: Diagnosis not present

## 2016-01-04 DIAGNOSIS — D649 Anemia, unspecified: Secondary | ICD-10-CM | POA: Diagnosis not present

## 2016-01-04 DIAGNOSIS — M109 Gout, unspecified: Secondary | ICD-10-CM | POA: Diagnosis not present

## 2016-01-04 DIAGNOSIS — Y842 Radiological procedure and radiotherapy as the cause of abnormal reaction of the patient, or of later complication, without mention of misadventure at the time of the procedure: Secondary | ICD-10-CM | POA: Diagnosis not present

## 2016-01-04 DIAGNOSIS — K8050 Calculus of bile duct without cholangitis or cholecystitis without obstruction: Secondary | ICD-10-CM | POA: Diagnosis not present

## 2016-01-04 DIAGNOSIS — K838 Other specified diseases of biliary tract: Secondary | ICD-10-CM | POA: Diagnosis not present

## 2016-01-04 DIAGNOSIS — C3490 Malignant neoplasm of unspecified part of unspecified bronchus or lung: Secondary | ICD-10-CM | POA: Diagnosis not present

## 2016-01-06 DIAGNOSIS — R918 Other nonspecific abnormal finding of lung field: Secondary | ICD-10-CM | POA: Diagnosis not present

## 2016-01-08 ENCOUNTER — Ambulatory Visit (HOSPITAL_COMMUNITY): Payer: Commercial Managed Care - HMO

## 2016-01-08 ENCOUNTER — Ambulatory Visit: Payer: Commercial Managed Care - HMO | Admitting: Family

## 2016-01-08 DIAGNOSIS — M109 Gout, unspecified: Secondary | ICD-10-CM | POA: Diagnosis not present

## 2016-01-08 DIAGNOSIS — E119 Type 2 diabetes mellitus without complications: Secondary | ICD-10-CM | POA: Diagnosis not present

## 2016-01-08 DIAGNOSIS — Z431 Encounter for attention to gastrostomy: Secondary | ICD-10-CM | POA: Diagnosis not present

## 2016-01-08 DIAGNOSIS — Y842 Radiological procedure and radiotherapy as the cause of abnormal reaction of the patient, or of later complication, without mention of misadventure at the time of the procedure: Secondary | ICD-10-CM | POA: Diagnosis not present

## 2016-01-08 DIAGNOSIS — C3490 Malignant neoplasm of unspecified part of unspecified bronchus or lung: Secondary | ICD-10-CM | POA: Diagnosis not present

## 2016-01-08 DIAGNOSIS — Z7984 Long term (current) use of oral hypoglycemic drugs: Secondary | ICD-10-CM | POA: Diagnosis not present

## 2016-01-08 DIAGNOSIS — I10 Essential (primary) hypertension: Secondary | ICD-10-CM | POA: Diagnosis not present

## 2016-01-08 DIAGNOSIS — R1310 Dysphagia, unspecified: Secondary | ICD-10-CM | POA: Diagnosis not present

## 2016-01-08 DIAGNOSIS — K208 Other esophagitis: Secondary | ICD-10-CM | POA: Diagnosis not present

## 2016-01-10 DIAGNOSIS — Z431 Encounter for attention to gastrostomy: Secondary | ICD-10-CM | POA: Diagnosis not present

## 2016-01-10 DIAGNOSIS — K208 Other esophagitis: Secondary | ICD-10-CM | POA: Diagnosis not present

## 2016-01-10 DIAGNOSIS — Y842 Radiological procedure and radiotherapy as the cause of abnormal reaction of the patient, or of later complication, without mention of misadventure at the time of the procedure: Secondary | ICD-10-CM | POA: Diagnosis not present

## 2016-01-10 DIAGNOSIS — R1310 Dysphagia, unspecified: Secondary | ICD-10-CM | POA: Diagnosis not present

## 2016-01-10 DIAGNOSIS — C3490 Malignant neoplasm of unspecified part of unspecified bronchus or lung: Secondary | ICD-10-CM | POA: Diagnosis not present

## 2016-01-10 DIAGNOSIS — M109 Gout, unspecified: Secondary | ICD-10-CM | POA: Diagnosis not present

## 2016-01-10 DIAGNOSIS — Z7984 Long term (current) use of oral hypoglycemic drugs: Secondary | ICD-10-CM | POA: Diagnosis not present

## 2016-01-10 DIAGNOSIS — I10 Essential (primary) hypertension: Secondary | ICD-10-CM | POA: Diagnosis not present

## 2016-01-10 DIAGNOSIS — E119 Type 2 diabetes mellitus without complications: Secondary | ICD-10-CM | POA: Diagnosis not present

## 2016-01-14 DIAGNOSIS — K208 Other esophagitis: Secondary | ICD-10-CM | POA: Diagnosis not present

## 2016-01-14 DIAGNOSIS — E119 Type 2 diabetes mellitus without complications: Secondary | ICD-10-CM | POA: Diagnosis not present

## 2016-01-14 DIAGNOSIS — Z7984 Long term (current) use of oral hypoglycemic drugs: Secondary | ICD-10-CM | POA: Diagnosis not present

## 2016-01-14 DIAGNOSIS — Y842 Radiological procedure and radiotherapy as the cause of abnormal reaction of the patient, or of later complication, without mention of misadventure at the time of the procedure: Secondary | ICD-10-CM | POA: Diagnosis not present

## 2016-01-14 DIAGNOSIS — M109 Gout, unspecified: Secondary | ICD-10-CM | POA: Diagnosis not present

## 2016-01-14 DIAGNOSIS — R1310 Dysphagia, unspecified: Secondary | ICD-10-CM | POA: Diagnosis not present

## 2016-01-14 DIAGNOSIS — C3490 Malignant neoplasm of unspecified part of unspecified bronchus or lung: Secondary | ICD-10-CM | POA: Diagnosis not present

## 2016-01-14 DIAGNOSIS — I10 Essential (primary) hypertension: Secondary | ICD-10-CM | POA: Diagnosis not present

## 2016-01-14 DIAGNOSIS — Z431 Encounter for attention to gastrostomy: Secondary | ICD-10-CM | POA: Diagnosis not present

## 2016-01-16 DIAGNOSIS — C3412 Malignant neoplasm of upper lobe, left bronchus or lung: Secondary | ICD-10-CM | POA: Diagnosis not present

## 2016-01-16 DIAGNOSIS — Z5112 Encounter for antineoplastic immunotherapy: Secondary | ICD-10-CM | POA: Diagnosis not present

## 2016-01-16 DIAGNOSIS — R64 Cachexia: Secondary | ICD-10-CM | POA: Diagnosis not present

## 2016-01-17 DIAGNOSIS — Z431 Encounter for attention to gastrostomy: Secondary | ICD-10-CM | POA: Diagnosis not present

## 2016-01-17 DIAGNOSIS — Y842 Radiological procedure and radiotherapy as the cause of abnormal reaction of the patient, or of later complication, without mention of misadventure at the time of the procedure: Secondary | ICD-10-CM | POA: Diagnosis not present

## 2016-01-17 DIAGNOSIS — I10 Essential (primary) hypertension: Secondary | ICD-10-CM | POA: Diagnosis not present

## 2016-01-17 DIAGNOSIS — Z7984 Long term (current) use of oral hypoglycemic drugs: Secondary | ICD-10-CM | POA: Diagnosis not present

## 2016-01-17 DIAGNOSIS — K208 Other esophagitis: Secondary | ICD-10-CM | POA: Diagnosis not present

## 2016-01-17 DIAGNOSIS — E119 Type 2 diabetes mellitus without complications: Secondary | ICD-10-CM | POA: Diagnosis not present

## 2016-01-17 DIAGNOSIS — M109 Gout, unspecified: Secondary | ICD-10-CM | POA: Diagnosis not present

## 2016-01-17 DIAGNOSIS — R1310 Dysphagia, unspecified: Secondary | ICD-10-CM | POA: Diagnosis not present

## 2016-01-17 DIAGNOSIS — C3490 Malignant neoplasm of unspecified part of unspecified bronchus or lung: Secondary | ICD-10-CM | POA: Diagnosis not present

## 2016-01-21 DIAGNOSIS — E119 Type 2 diabetes mellitus without complications: Secondary | ICD-10-CM | POA: Diagnosis not present

## 2016-01-21 DIAGNOSIS — R2689 Other abnormalities of gait and mobility: Secondary | ICD-10-CM | POA: Diagnosis not present

## 2016-01-21 DIAGNOSIS — Z9181 History of falling: Secondary | ICD-10-CM | POA: Diagnosis not present

## 2016-01-21 DIAGNOSIS — Z87891 Personal history of nicotine dependence: Secondary | ICD-10-CM | POA: Diagnosis not present

## 2016-01-21 DIAGNOSIS — I10 Essential (primary) hypertension: Secondary | ICD-10-CM | POA: Diagnosis not present

## 2016-01-21 DIAGNOSIS — M109 Gout, unspecified: Secondary | ICD-10-CM | POA: Diagnosis not present

## 2016-01-21 DIAGNOSIS — Z7984 Long term (current) use of oral hypoglycemic drugs: Secondary | ICD-10-CM | POA: Diagnosis not present

## 2016-01-21 DIAGNOSIS — Z931 Gastrostomy status: Secondary | ICD-10-CM | POA: Diagnosis not present

## 2016-01-21 DIAGNOSIS — C3490 Malignant neoplasm of unspecified part of unspecified bronchus or lung: Secondary | ICD-10-CM | POA: Diagnosis not present

## 2016-01-24 DIAGNOSIS — R2689 Other abnormalities of gait and mobility: Secondary | ICD-10-CM | POA: Diagnosis not present

## 2016-01-24 DIAGNOSIS — I10 Essential (primary) hypertension: Secondary | ICD-10-CM | POA: Diagnosis not present

## 2016-01-24 DIAGNOSIS — Z7984 Long term (current) use of oral hypoglycemic drugs: Secondary | ICD-10-CM | POA: Diagnosis not present

## 2016-01-24 DIAGNOSIS — E119 Type 2 diabetes mellitus without complications: Secondary | ICD-10-CM | POA: Diagnosis not present

## 2016-01-24 DIAGNOSIS — Z931 Gastrostomy status: Secondary | ICD-10-CM | POA: Diagnosis not present

## 2016-01-24 DIAGNOSIS — C3490 Malignant neoplasm of unspecified part of unspecified bronchus or lung: Secondary | ICD-10-CM | POA: Diagnosis not present

## 2016-01-24 DIAGNOSIS — Z87891 Personal history of nicotine dependence: Secondary | ICD-10-CM | POA: Diagnosis not present

## 2016-01-24 DIAGNOSIS — Z9181 History of falling: Secondary | ICD-10-CM | POA: Diagnosis not present

## 2016-01-24 DIAGNOSIS — M109 Gout, unspecified: Secondary | ICD-10-CM | POA: Diagnosis not present

## 2016-01-25 DIAGNOSIS — Z7984 Long term (current) use of oral hypoglycemic drugs: Secondary | ICD-10-CM | POA: Diagnosis not present

## 2016-01-25 DIAGNOSIS — I10 Essential (primary) hypertension: Secondary | ICD-10-CM | POA: Diagnosis not present

## 2016-01-25 DIAGNOSIS — C3490 Malignant neoplasm of unspecified part of unspecified bronchus or lung: Secondary | ICD-10-CM | POA: Diagnosis not present

## 2016-01-25 DIAGNOSIS — E119 Type 2 diabetes mellitus without complications: Secondary | ICD-10-CM | POA: Diagnosis not present

## 2016-01-25 DIAGNOSIS — Z87891 Personal history of nicotine dependence: Secondary | ICD-10-CM | POA: Diagnosis not present

## 2016-01-25 DIAGNOSIS — M109 Gout, unspecified: Secondary | ICD-10-CM | POA: Diagnosis not present

## 2016-01-25 DIAGNOSIS — R2689 Other abnormalities of gait and mobility: Secondary | ICD-10-CM | POA: Diagnosis not present

## 2016-01-25 DIAGNOSIS — Z931 Gastrostomy status: Secondary | ICD-10-CM | POA: Diagnosis not present

## 2016-01-25 DIAGNOSIS — Z9181 History of falling: Secondary | ICD-10-CM | POA: Diagnosis not present

## 2016-01-28 DIAGNOSIS — Z9181 History of falling: Secondary | ICD-10-CM | POA: Diagnosis not present

## 2016-01-28 DIAGNOSIS — M109 Gout, unspecified: Secondary | ICD-10-CM | POA: Diagnosis not present

## 2016-01-28 DIAGNOSIS — R2689 Other abnormalities of gait and mobility: Secondary | ICD-10-CM | POA: Diagnosis not present

## 2016-01-28 DIAGNOSIS — Z7984 Long term (current) use of oral hypoglycemic drugs: Secondary | ICD-10-CM | POA: Diagnosis not present

## 2016-01-28 DIAGNOSIS — E119 Type 2 diabetes mellitus without complications: Secondary | ICD-10-CM | POA: Diagnosis not present

## 2016-01-28 DIAGNOSIS — Z931 Gastrostomy status: Secondary | ICD-10-CM | POA: Diagnosis not present

## 2016-01-28 DIAGNOSIS — I10 Essential (primary) hypertension: Secondary | ICD-10-CM | POA: Diagnosis not present

## 2016-01-28 DIAGNOSIS — Z87891 Personal history of nicotine dependence: Secondary | ICD-10-CM | POA: Diagnosis not present

## 2016-01-28 DIAGNOSIS — C3490 Malignant neoplasm of unspecified part of unspecified bronchus or lung: Secondary | ICD-10-CM | POA: Diagnosis not present

## 2016-01-30 DIAGNOSIS — E119 Type 2 diabetes mellitus without complications: Secondary | ICD-10-CM | POA: Diagnosis not present

## 2016-01-30 DIAGNOSIS — Z9181 History of falling: Secondary | ICD-10-CM | POA: Diagnosis not present

## 2016-01-30 DIAGNOSIS — C3490 Malignant neoplasm of unspecified part of unspecified bronchus or lung: Secondary | ICD-10-CM | POA: Diagnosis not present

## 2016-01-30 DIAGNOSIS — M109 Gout, unspecified: Secondary | ICD-10-CM | POA: Diagnosis not present

## 2016-01-30 DIAGNOSIS — Z931 Gastrostomy status: Secondary | ICD-10-CM | POA: Diagnosis not present

## 2016-01-30 DIAGNOSIS — R2689 Other abnormalities of gait and mobility: Secondary | ICD-10-CM | POA: Diagnosis not present

## 2016-01-30 DIAGNOSIS — I10 Essential (primary) hypertension: Secondary | ICD-10-CM | POA: Diagnosis not present

## 2016-01-30 DIAGNOSIS — Z87891 Personal history of nicotine dependence: Secondary | ICD-10-CM | POA: Diagnosis not present

## 2016-01-30 DIAGNOSIS — Z7984 Long term (current) use of oral hypoglycemic drugs: Secondary | ICD-10-CM | POA: Diagnosis not present

## 2016-01-31 DIAGNOSIS — C3490 Malignant neoplasm of unspecified part of unspecified bronchus or lung: Secondary | ICD-10-CM | POA: Diagnosis not present

## 2016-01-31 DIAGNOSIS — Z431 Encounter for attention to gastrostomy: Secondary | ICD-10-CM | POA: Diagnosis not present

## 2016-01-31 DIAGNOSIS — K9423 Gastrostomy malfunction: Secondary | ICD-10-CM | POA: Diagnosis not present

## 2016-02-06 DIAGNOSIS — R918 Other nonspecific abnormal finding of lung field: Secondary | ICD-10-CM | POA: Diagnosis not present

## 2016-02-11 DIAGNOSIS — Z931 Gastrostomy status: Secondary | ICD-10-CM | POA: Diagnosis not present

## 2016-02-11 DIAGNOSIS — I10 Essential (primary) hypertension: Secondary | ICD-10-CM | POA: Diagnosis not present

## 2016-02-11 DIAGNOSIS — Z87891 Personal history of nicotine dependence: Secondary | ICD-10-CM | POA: Diagnosis not present

## 2016-02-11 DIAGNOSIS — M109 Gout, unspecified: Secondary | ICD-10-CM | POA: Diagnosis not present

## 2016-02-11 DIAGNOSIS — C3490 Malignant neoplasm of unspecified part of unspecified bronchus or lung: Secondary | ICD-10-CM | POA: Diagnosis not present

## 2016-02-11 DIAGNOSIS — Z9181 History of falling: Secondary | ICD-10-CM | POA: Diagnosis not present

## 2016-02-11 DIAGNOSIS — Z7984 Long term (current) use of oral hypoglycemic drugs: Secondary | ICD-10-CM | POA: Diagnosis not present

## 2016-02-11 DIAGNOSIS — E119 Type 2 diabetes mellitus without complications: Secondary | ICD-10-CM | POA: Diagnosis not present

## 2016-02-11 DIAGNOSIS — R2689 Other abnormalities of gait and mobility: Secondary | ICD-10-CM | POA: Diagnosis not present

## 2016-02-14 DIAGNOSIS — E876 Hypokalemia: Secondary | ICD-10-CM | POA: Diagnosis not present

## 2016-02-14 DIAGNOSIS — C772 Secondary and unspecified malignant neoplasm of intra-abdominal lymph nodes: Secondary | ICD-10-CM | POA: Diagnosis not present

## 2016-02-14 DIAGNOSIS — Z9981 Dependence on supplemental oxygen: Secondary | ICD-10-CM | POA: Diagnosis not present

## 2016-02-14 DIAGNOSIS — D696 Thrombocytopenia, unspecified: Secondary | ICD-10-CM | POA: Diagnosis not present

## 2016-02-14 DIAGNOSIS — C3412 Malignant neoplasm of upper lobe, left bronchus or lung: Secondary | ICD-10-CM | POA: Diagnosis not present

## 2016-02-14 DIAGNOSIS — G629 Polyneuropathy, unspecified: Secondary | ICD-10-CM | POA: Diagnosis not present

## 2016-02-14 DIAGNOSIS — R946 Abnormal results of thyroid function studies: Secondary | ICD-10-CM | POA: Diagnosis not present

## 2016-02-14 DIAGNOSIS — R634 Abnormal weight loss: Secondary | ICD-10-CM | POA: Diagnosis not present

## 2016-02-14 DIAGNOSIS — Z5111 Encounter for antineoplastic chemotherapy: Secondary | ICD-10-CM | POA: Diagnosis not present

## 2016-02-14 DIAGNOSIS — H6121 Impacted cerumen, right ear: Secondary | ICD-10-CM | POA: Diagnosis not present

## 2016-02-14 DIAGNOSIS — R0902 Hypoxemia: Secondary | ICD-10-CM | POA: Diagnosis not present

## 2016-02-14 DIAGNOSIS — Z931 Gastrostomy status: Secondary | ICD-10-CM | POA: Diagnosis not present

## 2016-02-15 DIAGNOSIS — C3490 Malignant neoplasm of unspecified part of unspecified bronchus or lung: Secondary | ICD-10-CM | POA: Diagnosis not present

## 2016-02-15 DIAGNOSIS — R2689 Other abnormalities of gait and mobility: Secondary | ICD-10-CM | POA: Diagnosis not present

## 2016-02-15 DIAGNOSIS — Z87891 Personal history of nicotine dependence: Secondary | ICD-10-CM | POA: Diagnosis not present

## 2016-02-15 DIAGNOSIS — Z7984 Long term (current) use of oral hypoglycemic drugs: Secondary | ICD-10-CM | POA: Diagnosis not present

## 2016-02-15 DIAGNOSIS — Z9181 History of falling: Secondary | ICD-10-CM | POA: Diagnosis not present

## 2016-02-15 DIAGNOSIS — E119 Type 2 diabetes mellitus without complications: Secondary | ICD-10-CM | POA: Diagnosis not present

## 2016-02-15 DIAGNOSIS — R64 Cachexia: Secondary | ICD-10-CM | POA: Diagnosis not present

## 2016-02-15 DIAGNOSIS — Z931 Gastrostomy status: Secondary | ICD-10-CM | POA: Diagnosis not present

## 2016-02-15 DIAGNOSIS — M109 Gout, unspecified: Secondary | ICD-10-CM | POA: Diagnosis not present

## 2016-02-15 DIAGNOSIS — I10 Essential (primary) hypertension: Secondary | ICD-10-CM | POA: Diagnosis not present

## 2016-02-19 DIAGNOSIS — Z9181 History of falling: Secondary | ICD-10-CM | POA: Diagnosis not present

## 2016-02-19 DIAGNOSIS — R2689 Other abnormalities of gait and mobility: Secondary | ICD-10-CM | POA: Diagnosis not present

## 2016-02-19 DIAGNOSIS — C3490 Malignant neoplasm of unspecified part of unspecified bronchus or lung: Secondary | ICD-10-CM | POA: Diagnosis not present

## 2016-02-19 DIAGNOSIS — Z931 Gastrostomy status: Secondary | ICD-10-CM | POA: Diagnosis not present

## 2016-02-19 DIAGNOSIS — Z87891 Personal history of nicotine dependence: Secondary | ICD-10-CM | POA: Diagnosis not present

## 2016-02-19 DIAGNOSIS — I10 Essential (primary) hypertension: Secondary | ICD-10-CM | POA: Diagnosis not present

## 2016-02-19 DIAGNOSIS — E119 Type 2 diabetes mellitus without complications: Secondary | ICD-10-CM | POA: Diagnosis not present

## 2016-02-19 DIAGNOSIS — M109 Gout, unspecified: Secondary | ICD-10-CM | POA: Diagnosis not present

## 2016-02-19 DIAGNOSIS — Z7984 Long term (current) use of oral hypoglycemic drugs: Secondary | ICD-10-CM | POA: Diagnosis not present

## 2016-02-21 DIAGNOSIS — C3490 Malignant neoplasm of unspecified part of unspecified bronchus or lung: Secondary | ICD-10-CM | POA: Diagnosis not present

## 2016-02-21 DIAGNOSIS — R2689 Other abnormalities of gait and mobility: Secondary | ICD-10-CM | POA: Diagnosis not present

## 2016-02-21 DIAGNOSIS — M109 Gout, unspecified: Secondary | ICD-10-CM | POA: Diagnosis not present

## 2016-02-21 DIAGNOSIS — Z7984 Long term (current) use of oral hypoglycemic drugs: Secondary | ICD-10-CM | POA: Diagnosis not present

## 2016-02-21 DIAGNOSIS — E119 Type 2 diabetes mellitus without complications: Secondary | ICD-10-CM | POA: Diagnosis not present

## 2016-02-21 DIAGNOSIS — Z9181 History of falling: Secondary | ICD-10-CM | POA: Diagnosis not present

## 2016-02-21 DIAGNOSIS — I10 Essential (primary) hypertension: Secondary | ICD-10-CM | POA: Diagnosis not present

## 2016-02-21 DIAGNOSIS — Z87891 Personal history of nicotine dependence: Secondary | ICD-10-CM | POA: Diagnosis not present

## 2016-02-21 DIAGNOSIS — Z931 Gastrostomy status: Secondary | ICD-10-CM | POA: Diagnosis not present

## 2016-03-03 DIAGNOSIS — Z7984 Long term (current) use of oral hypoglycemic drugs: Secondary | ICD-10-CM | POA: Diagnosis not present

## 2016-03-03 DIAGNOSIS — C3490 Malignant neoplasm of unspecified part of unspecified bronchus or lung: Secondary | ICD-10-CM | POA: Diagnosis not present

## 2016-03-03 DIAGNOSIS — E119 Type 2 diabetes mellitus without complications: Secondary | ICD-10-CM | POA: Diagnosis not present

## 2016-03-03 DIAGNOSIS — R2689 Other abnormalities of gait and mobility: Secondary | ICD-10-CM | POA: Diagnosis not present

## 2016-03-03 DIAGNOSIS — M109 Gout, unspecified: Secondary | ICD-10-CM | POA: Diagnosis not present

## 2016-03-03 DIAGNOSIS — Z87891 Personal history of nicotine dependence: Secondary | ICD-10-CM | POA: Diagnosis not present

## 2016-03-03 DIAGNOSIS — Z931 Gastrostomy status: Secondary | ICD-10-CM | POA: Diagnosis not present

## 2016-03-03 DIAGNOSIS — Z9181 History of falling: Secondary | ICD-10-CM | POA: Diagnosis not present

## 2016-03-03 DIAGNOSIS — I10 Essential (primary) hypertension: Secondary | ICD-10-CM | POA: Diagnosis not present

## 2016-03-05 DIAGNOSIS — R946 Abnormal results of thyroid function studies: Secondary | ICD-10-CM | POA: Diagnosis not present

## 2016-03-05 DIAGNOSIS — Z5111 Encounter for antineoplastic chemotherapy: Secondary | ICD-10-CM | POA: Diagnosis not present

## 2016-03-05 DIAGNOSIS — D696 Thrombocytopenia, unspecified: Secondary | ICD-10-CM | POA: Diagnosis not present

## 2016-03-05 DIAGNOSIS — E876 Hypokalemia: Secondary | ICD-10-CM | POA: Diagnosis not present

## 2016-03-05 DIAGNOSIS — C772 Secondary and unspecified malignant neoplasm of intra-abdominal lymph nodes: Secondary | ICD-10-CM | POA: Diagnosis not present

## 2016-03-05 DIAGNOSIS — R634 Abnormal weight loss: Secondary | ICD-10-CM | POA: Diagnosis not present

## 2016-03-05 DIAGNOSIS — R0902 Hypoxemia: Secondary | ICD-10-CM | POA: Diagnosis not present

## 2016-03-05 DIAGNOSIS — G629 Polyneuropathy, unspecified: Secondary | ICD-10-CM | POA: Diagnosis not present

## 2016-03-05 DIAGNOSIS — H6121 Impacted cerumen, right ear: Secondary | ICD-10-CM | POA: Diagnosis not present

## 2016-03-05 DIAGNOSIS — Z9981 Dependence on supplemental oxygen: Secondary | ICD-10-CM | POA: Diagnosis not present

## 2016-03-05 DIAGNOSIS — Z931 Gastrostomy status: Secondary | ICD-10-CM | POA: Diagnosis not present

## 2016-03-05 DIAGNOSIS — C3412 Malignant neoplasm of upper lobe, left bronchus or lung: Secondary | ICD-10-CM | POA: Diagnosis not present

## 2016-03-06 DIAGNOSIS — E119 Type 2 diabetes mellitus without complications: Secondary | ICD-10-CM | POA: Diagnosis not present

## 2016-03-06 DIAGNOSIS — Z931 Gastrostomy status: Secondary | ICD-10-CM | POA: Diagnosis not present

## 2016-03-06 DIAGNOSIS — I10 Essential (primary) hypertension: Secondary | ICD-10-CM | POA: Diagnosis not present

## 2016-03-06 DIAGNOSIS — R2689 Other abnormalities of gait and mobility: Secondary | ICD-10-CM | POA: Diagnosis not present

## 2016-03-06 DIAGNOSIS — M109 Gout, unspecified: Secondary | ICD-10-CM | POA: Diagnosis not present

## 2016-03-06 DIAGNOSIS — C3490 Malignant neoplasm of unspecified part of unspecified bronchus or lung: Secondary | ICD-10-CM | POA: Diagnosis not present

## 2016-03-06 DIAGNOSIS — Z87891 Personal history of nicotine dependence: Secondary | ICD-10-CM | POA: Diagnosis not present

## 2016-03-06 DIAGNOSIS — Z9181 History of falling: Secondary | ICD-10-CM | POA: Diagnosis not present

## 2016-03-06 DIAGNOSIS — Z7984 Long term (current) use of oral hypoglycemic drugs: Secondary | ICD-10-CM | POA: Diagnosis not present

## 2016-03-07 DIAGNOSIS — I10 Essential (primary) hypertension: Secondary | ICD-10-CM | POA: Diagnosis not present

## 2016-03-07 DIAGNOSIS — Z931 Gastrostomy status: Secondary | ICD-10-CM | POA: Diagnosis not present

## 2016-03-07 DIAGNOSIS — C3490 Malignant neoplasm of unspecified part of unspecified bronchus or lung: Secondary | ICD-10-CM | POA: Diagnosis not present

## 2016-03-07 DIAGNOSIS — E119 Type 2 diabetes mellitus without complications: Secondary | ICD-10-CM | POA: Diagnosis not present

## 2016-03-07 DIAGNOSIS — R2689 Other abnormalities of gait and mobility: Secondary | ICD-10-CM | POA: Diagnosis not present

## 2016-03-07 DIAGNOSIS — Z7984 Long term (current) use of oral hypoglycemic drugs: Secondary | ICD-10-CM | POA: Diagnosis not present

## 2016-03-07 DIAGNOSIS — Z87891 Personal history of nicotine dependence: Secondary | ICD-10-CM | POA: Diagnosis not present

## 2016-03-07 DIAGNOSIS — M109 Gout, unspecified: Secondary | ICD-10-CM | POA: Diagnosis not present

## 2016-03-07 DIAGNOSIS — R918 Other nonspecific abnormal finding of lung field: Secondary | ICD-10-CM | POA: Diagnosis not present

## 2016-03-07 DIAGNOSIS — Z9181 History of falling: Secondary | ICD-10-CM | POA: Diagnosis not present

## 2016-03-10 DIAGNOSIS — E119 Type 2 diabetes mellitus without complications: Secondary | ICD-10-CM | POA: Diagnosis not present

## 2016-03-10 DIAGNOSIS — C3490 Malignant neoplasm of unspecified part of unspecified bronchus or lung: Secondary | ICD-10-CM | POA: Diagnosis not present

## 2016-03-10 DIAGNOSIS — Z9181 History of falling: Secondary | ICD-10-CM | POA: Diagnosis not present

## 2016-03-10 DIAGNOSIS — I10 Essential (primary) hypertension: Secondary | ICD-10-CM | POA: Diagnosis not present

## 2016-03-10 DIAGNOSIS — Z7984 Long term (current) use of oral hypoglycemic drugs: Secondary | ICD-10-CM | POA: Diagnosis not present

## 2016-03-10 DIAGNOSIS — R2689 Other abnormalities of gait and mobility: Secondary | ICD-10-CM | POA: Diagnosis not present

## 2016-03-10 DIAGNOSIS — Z931 Gastrostomy status: Secondary | ICD-10-CM | POA: Diagnosis not present

## 2016-03-10 DIAGNOSIS — Z87891 Personal history of nicotine dependence: Secondary | ICD-10-CM | POA: Diagnosis not present

## 2016-03-10 DIAGNOSIS — M109 Gout, unspecified: Secondary | ICD-10-CM | POA: Diagnosis not present

## 2016-03-12 DIAGNOSIS — I10 Essential (primary) hypertension: Secondary | ICD-10-CM | POA: Diagnosis not present

## 2016-03-12 DIAGNOSIS — E119 Type 2 diabetes mellitus without complications: Secondary | ICD-10-CM | POA: Diagnosis not present

## 2016-03-12 DIAGNOSIS — Z9181 History of falling: Secondary | ICD-10-CM | POA: Diagnosis not present

## 2016-03-12 DIAGNOSIS — M109 Gout, unspecified: Secondary | ICD-10-CM | POA: Diagnosis not present

## 2016-03-12 DIAGNOSIS — Z7984 Long term (current) use of oral hypoglycemic drugs: Secondary | ICD-10-CM | POA: Diagnosis not present

## 2016-03-12 DIAGNOSIS — C3490 Malignant neoplasm of unspecified part of unspecified bronchus or lung: Secondary | ICD-10-CM | POA: Diagnosis not present

## 2016-03-12 DIAGNOSIS — Z87891 Personal history of nicotine dependence: Secondary | ICD-10-CM | POA: Diagnosis not present

## 2016-03-12 DIAGNOSIS — R2689 Other abnormalities of gait and mobility: Secondary | ICD-10-CM | POA: Diagnosis not present

## 2016-03-12 DIAGNOSIS — Z931 Gastrostomy status: Secondary | ICD-10-CM | POA: Diagnosis not present

## 2016-03-13 DIAGNOSIS — Z931 Gastrostomy status: Secondary | ICD-10-CM | POA: Diagnosis not present

## 2016-03-13 DIAGNOSIS — Z9181 History of falling: Secondary | ICD-10-CM | POA: Diagnosis not present

## 2016-03-13 DIAGNOSIS — E119 Type 2 diabetes mellitus without complications: Secondary | ICD-10-CM | POA: Diagnosis not present

## 2016-03-13 DIAGNOSIS — Z7984 Long term (current) use of oral hypoglycemic drugs: Secondary | ICD-10-CM | POA: Diagnosis not present

## 2016-03-13 DIAGNOSIS — I10 Essential (primary) hypertension: Secondary | ICD-10-CM | POA: Diagnosis not present

## 2016-03-13 DIAGNOSIS — M109 Gout, unspecified: Secondary | ICD-10-CM | POA: Diagnosis not present

## 2016-03-13 DIAGNOSIS — R2689 Other abnormalities of gait and mobility: Secondary | ICD-10-CM | POA: Diagnosis not present

## 2016-03-13 DIAGNOSIS — C3490 Malignant neoplasm of unspecified part of unspecified bronchus or lung: Secondary | ICD-10-CM | POA: Diagnosis not present

## 2016-03-13 DIAGNOSIS — Z87891 Personal history of nicotine dependence: Secondary | ICD-10-CM | POA: Diagnosis not present

## 2016-03-17 DIAGNOSIS — R64 Cachexia: Secondary | ICD-10-CM | POA: Diagnosis not present

## 2016-03-17 DIAGNOSIS — Z7984 Long term (current) use of oral hypoglycemic drugs: Secondary | ICD-10-CM | POA: Diagnosis not present

## 2016-03-17 DIAGNOSIS — Z931 Gastrostomy status: Secondary | ICD-10-CM | POA: Diagnosis not present

## 2016-03-17 DIAGNOSIS — Z9181 History of falling: Secondary | ICD-10-CM | POA: Diagnosis not present

## 2016-03-17 DIAGNOSIS — M109 Gout, unspecified: Secondary | ICD-10-CM | POA: Diagnosis not present

## 2016-03-17 DIAGNOSIS — Z87891 Personal history of nicotine dependence: Secondary | ICD-10-CM | POA: Diagnosis not present

## 2016-03-17 DIAGNOSIS — E119 Type 2 diabetes mellitus without complications: Secondary | ICD-10-CM | POA: Diagnosis not present

## 2016-03-17 DIAGNOSIS — R2689 Other abnormalities of gait and mobility: Secondary | ICD-10-CM | POA: Diagnosis not present

## 2016-03-17 DIAGNOSIS — I10 Essential (primary) hypertension: Secondary | ICD-10-CM | POA: Diagnosis not present

## 2016-03-17 DIAGNOSIS — C3490 Malignant neoplasm of unspecified part of unspecified bronchus or lung: Secondary | ICD-10-CM | POA: Diagnosis not present

## 2016-03-18 DIAGNOSIS — Z9181 History of falling: Secondary | ICD-10-CM | POA: Diagnosis not present

## 2016-03-18 DIAGNOSIS — Z7984 Long term (current) use of oral hypoglycemic drugs: Secondary | ICD-10-CM | POA: Diagnosis not present

## 2016-03-18 DIAGNOSIS — C3490 Malignant neoplasm of unspecified part of unspecified bronchus or lung: Secondary | ICD-10-CM | POA: Diagnosis not present

## 2016-03-18 DIAGNOSIS — I10 Essential (primary) hypertension: Secondary | ICD-10-CM | POA: Diagnosis not present

## 2016-03-18 DIAGNOSIS — Z87891 Personal history of nicotine dependence: Secondary | ICD-10-CM | POA: Diagnosis not present

## 2016-03-18 DIAGNOSIS — E119 Type 2 diabetes mellitus without complications: Secondary | ICD-10-CM | POA: Diagnosis not present

## 2016-03-18 DIAGNOSIS — R2689 Other abnormalities of gait and mobility: Secondary | ICD-10-CM | POA: Diagnosis not present

## 2016-03-18 DIAGNOSIS — M109 Gout, unspecified: Secondary | ICD-10-CM | POA: Diagnosis not present

## 2016-03-18 DIAGNOSIS — Z931 Gastrostomy status: Secondary | ICD-10-CM | POA: Diagnosis not present

## 2016-03-19 DIAGNOSIS — I081 Rheumatic disorders of both mitral and tricuspid valves: Secondary | ICD-10-CM | POA: Diagnosis not present

## 2016-03-19 DIAGNOSIS — I5023 Acute on chronic systolic (congestive) heart failure: Secondary | ICD-10-CM | POA: Diagnosis not present

## 2016-03-19 DIAGNOSIS — I509 Heart failure, unspecified: Secondary | ICD-10-CM | POA: Diagnosis not present

## 2016-03-19 DIAGNOSIS — E8770 Fluid overload, unspecified: Secondary | ICD-10-CM | POA: Diagnosis not present

## 2016-03-19 DIAGNOSIS — I4892 Unspecified atrial flutter: Secondary | ICD-10-CM | POA: Diagnosis not present

## 2016-03-19 DIAGNOSIS — I959 Hypotension, unspecified: Secondary | ICD-10-CM | POA: Diagnosis not present

## 2016-03-19 DIAGNOSIS — R600 Localized edema: Secondary | ICD-10-CM | POA: Diagnosis not present

## 2016-03-19 DIAGNOSIS — J449 Chronic obstructive pulmonary disease, unspecified: Secondary | ICD-10-CM | POA: Diagnosis not present

## 2016-03-19 DIAGNOSIS — E119 Type 2 diabetes mellitus without complications: Secondary | ICD-10-CM | POA: Diagnosis not present

## 2016-03-19 DIAGNOSIS — D649 Anemia, unspecified: Secondary | ICD-10-CM | POA: Diagnosis not present

## 2016-03-19 DIAGNOSIS — E0590 Thyrotoxicosis, unspecified without thyrotoxic crisis or storm: Secondary | ICD-10-CM | POA: Diagnosis not present

## 2016-03-19 DIAGNOSIS — I429 Cardiomyopathy, unspecified: Secondary | ICD-10-CM | POA: Diagnosis not present

## 2016-03-19 DIAGNOSIS — Z923 Personal history of irradiation: Secondary | ICD-10-CM | POA: Diagnosis not present

## 2016-03-19 DIAGNOSIS — I517 Cardiomegaly: Secondary | ICD-10-CM | POA: Diagnosis not present

## 2016-03-19 DIAGNOSIS — I10 Essential (primary) hypertension: Secondary | ICD-10-CM | POA: Diagnosis not present

## 2016-03-19 DIAGNOSIS — R0602 Shortness of breath: Secondary | ICD-10-CM | POA: Diagnosis not present

## 2016-03-19 DIAGNOSIS — J811 Chronic pulmonary edema: Secondary | ICD-10-CM | POA: Diagnosis not present

## 2016-03-19 DIAGNOSIS — I358 Other nonrheumatic aortic valve disorders: Secondary | ICD-10-CM | POA: Diagnosis not present

## 2016-03-19 DIAGNOSIS — J90 Pleural effusion, not elsewhere classified: Secondary | ICD-10-CM | POA: Diagnosis not present

## 2016-03-19 DIAGNOSIS — Z87891 Personal history of nicotine dependence: Secondary | ICD-10-CM | POA: Diagnosis not present

## 2016-03-19 DIAGNOSIS — C3490 Malignant neoplasm of unspecified part of unspecified bronchus or lung: Secondary | ICD-10-CM | POA: Diagnosis not present

## 2016-03-19 DIAGNOSIS — Z7984 Long term (current) use of oral hypoglycemic drugs: Secondary | ICD-10-CM | POA: Diagnosis not present

## 2016-03-19 DIAGNOSIS — J441 Chronic obstructive pulmonary disease with (acute) exacerbation: Secondary | ICD-10-CM | POA: Diagnosis not present

## 2016-03-19 DIAGNOSIS — I9589 Other hypotension: Secondary | ICD-10-CM | POA: Diagnosis not present

## 2016-03-19 DIAGNOSIS — I4891 Unspecified atrial fibrillation: Secondary | ICD-10-CM | POA: Diagnosis not present

## 2016-03-19 DIAGNOSIS — R918 Other nonspecific abnormal finding of lung field: Secondary | ICD-10-CM | POA: Diagnosis not present

## 2016-03-19 DIAGNOSIS — R630 Anorexia: Secondary | ICD-10-CM | POA: Diagnosis not present

## 2016-03-19 DIAGNOSIS — C3412 Malignant neoplasm of upper lobe, left bronchus or lung: Secondary | ICD-10-CM | POA: Diagnosis not present

## 2016-03-19 DIAGNOSIS — I5189 Other ill-defined heart diseases: Secondary | ICD-10-CM | POA: Diagnosis not present

## 2016-03-19 DIAGNOSIS — M109 Gout, unspecified: Secondary | ICD-10-CM | POA: Diagnosis not present

## 2016-03-19 DIAGNOSIS — R000 Tachycardia, unspecified: Secondary | ICD-10-CM | POA: Diagnosis not present

## 2016-03-19 DIAGNOSIS — J9601 Acute respiratory failure with hypoxia: Secondary | ICD-10-CM | POA: Diagnosis not present

## 2016-03-19 DIAGNOSIS — I5021 Acute systolic (congestive) heart failure: Secondary | ICD-10-CM | POA: Diagnosis not present

## 2016-03-19 DIAGNOSIS — J9621 Acute and chronic respiratory failure with hypoxia: Secondary | ICD-10-CM | POA: Diagnosis not present

## 2016-03-19 DIAGNOSIS — G4733 Obstructive sleep apnea (adult) (pediatric): Secondary | ICD-10-CM | POA: Diagnosis not present

## 2016-03-19 DIAGNOSIS — Z931 Gastrostomy status: Secondary | ICD-10-CM | POA: Diagnosis not present

## 2016-03-19 DIAGNOSIS — S40911D Unspecified superficial injury of right shoulder, subsequent encounter: Secondary | ICD-10-CM | POA: Diagnosis not present

## 2016-03-19 DIAGNOSIS — Z9981 Dependence on supplemental oxygen: Secondary | ICD-10-CM | POA: Diagnosis not present

## 2016-03-19 DIAGNOSIS — R2689 Other abnormalities of gait and mobility: Secondary | ICD-10-CM | POA: Diagnosis not present

## 2016-03-20 DIAGNOSIS — R2689 Other abnormalities of gait and mobility: Secondary | ICD-10-CM | POA: Diagnosis not present

## 2016-03-20 DIAGNOSIS — I10 Essential (primary) hypertension: Secondary | ICD-10-CM | POA: Diagnosis not present

## 2016-03-20 DIAGNOSIS — S40911D Unspecified superficial injury of right shoulder, subsequent encounter: Secondary | ICD-10-CM | POA: Diagnosis not present

## 2016-03-20 DIAGNOSIS — E119 Type 2 diabetes mellitus without complications: Secondary | ICD-10-CM | POA: Diagnosis not present

## 2016-03-20 DIAGNOSIS — M109 Gout, unspecified: Secondary | ICD-10-CM | POA: Diagnosis not present

## 2016-03-20 DIAGNOSIS — Z7984 Long term (current) use of oral hypoglycemic drugs: Secondary | ICD-10-CM | POA: Diagnosis not present

## 2016-03-20 DIAGNOSIS — Z931 Gastrostomy status: Secondary | ICD-10-CM | POA: Diagnosis not present

## 2016-03-20 DIAGNOSIS — C3490 Malignant neoplasm of unspecified part of unspecified bronchus or lung: Secondary | ICD-10-CM | POA: Diagnosis not present

## 2016-03-20 DIAGNOSIS — Z87891 Personal history of nicotine dependence: Secondary | ICD-10-CM | POA: Diagnosis not present

## 2016-03-21 DIAGNOSIS — I4891 Unspecified atrial fibrillation: Secondary | ICD-10-CM | POA: Diagnosis not present

## 2016-03-29 DIAGNOSIS — Z87891 Personal history of nicotine dependence: Secondary | ICD-10-CM | POA: Diagnosis not present

## 2016-03-29 DIAGNOSIS — E119 Type 2 diabetes mellitus without complications: Secondary | ICD-10-CM | POA: Diagnosis not present

## 2016-03-29 DIAGNOSIS — M109 Gout, unspecified: Secondary | ICD-10-CM | POA: Diagnosis not present

## 2016-03-29 DIAGNOSIS — Z931 Gastrostomy status: Secondary | ICD-10-CM | POA: Diagnosis not present

## 2016-03-29 DIAGNOSIS — S40911D Unspecified superficial injury of right shoulder, subsequent encounter: Secondary | ICD-10-CM | POA: Diagnosis not present

## 2016-03-29 DIAGNOSIS — R2689 Other abnormalities of gait and mobility: Secondary | ICD-10-CM | POA: Diagnosis not present

## 2016-03-29 DIAGNOSIS — Z7984 Long term (current) use of oral hypoglycemic drugs: Secondary | ICD-10-CM | POA: Diagnosis not present

## 2016-03-29 DIAGNOSIS — C3490 Malignant neoplasm of unspecified part of unspecified bronchus or lung: Secondary | ICD-10-CM | POA: Diagnosis not present

## 2016-03-29 DIAGNOSIS — I10 Essential (primary) hypertension: Secondary | ICD-10-CM | POA: Diagnosis not present

## 2016-04-01 DIAGNOSIS — I10 Essential (primary) hypertension: Secondary | ICD-10-CM | POA: Diagnosis not present

## 2016-04-01 DIAGNOSIS — E119 Type 2 diabetes mellitus without complications: Secondary | ICD-10-CM | POA: Diagnosis not present

## 2016-04-01 DIAGNOSIS — R2689 Other abnormalities of gait and mobility: Secondary | ICD-10-CM | POA: Diagnosis not present

## 2016-04-01 DIAGNOSIS — Z7984 Long term (current) use of oral hypoglycemic drugs: Secondary | ICD-10-CM | POA: Diagnosis not present

## 2016-04-01 DIAGNOSIS — C3490 Malignant neoplasm of unspecified part of unspecified bronchus or lung: Secondary | ICD-10-CM | POA: Diagnosis not present

## 2016-04-01 DIAGNOSIS — Z931 Gastrostomy status: Secondary | ICD-10-CM | POA: Diagnosis not present

## 2016-04-01 DIAGNOSIS — S40911D Unspecified superficial injury of right shoulder, subsequent encounter: Secondary | ICD-10-CM | POA: Diagnosis not present

## 2016-04-01 DIAGNOSIS — Z87891 Personal history of nicotine dependence: Secondary | ICD-10-CM | POA: Diagnosis not present

## 2016-04-01 DIAGNOSIS — M109 Gout, unspecified: Secondary | ICD-10-CM | POA: Diagnosis not present

## 2016-04-03 DIAGNOSIS — R2689 Other abnormalities of gait and mobility: Secondary | ICD-10-CM | POA: Diagnosis not present

## 2016-04-03 DIAGNOSIS — Z7984 Long term (current) use of oral hypoglycemic drugs: Secondary | ICD-10-CM | POA: Diagnosis not present

## 2016-04-03 DIAGNOSIS — I10 Essential (primary) hypertension: Secondary | ICD-10-CM | POA: Diagnosis not present

## 2016-04-03 DIAGNOSIS — E119 Type 2 diabetes mellitus without complications: Secondary | ICD-10-CM | POA: Diagnosis not present

## 2016-04-03 DIAGNOSIS — Z931 Gastrostomy status: Secondary | ICD-10-CM | POA: Diagnosis not present

## 2016-04-03 DIAGNOSIS — C3490 Malignant neoplasm of unspecified part of unspecified bronchus or lung: Secondary | ICD-10-CM | POA: Diagnosis not present

## 2016-04-03 DIAGNOSIS — Z87891 Personal history of nicotine dependence: Secondary | ICD-10-CM | POA: Diagnosis not present

## 2016-04-03 DIAGNOSIS — S40911D Unspecified superficial injury of right shoulder, subsequent encounter: Secondary | ICD-10-CM | POA: Diagnosis not present

## 2016-04-03 DIAGNOSIS — M109 Gout, unspecified: Secondary | ICD-10-CM | POA: Diagnosis not present

## 2016-04-07 DIAGNOSIS — E119 Type 2 diabetes mellitus without complications: Secondary | ICD-10-CM | POA: Diagnosis not present

## 2016-04-07 DIAGNOSIS — R918 Other nonspecific abnormal finding of lung field: Secondary | ICD-10-CM | POA: Diagnosis not present

## 2016-04-07 DIAGNOSIS — S40911D Unspecified superficial injury of right shoulder, subsequent encounter: Secondary | ICD-10-CM | POA: Diagnosis not present

## 2016-04-07 DIAGNOSIS — R2689 Other abnormalities of gait and mobility: Secondary | ICD-10-CM | POA: Diagnosis not present

## 2016-04-07 DIAGNOSIS — Z87891 Personal history of nicotine dependence: Secondary | ICD-10-CM | POA: Diagnosis not present

## 2016-04-07 DIAGNOSIS — I10 Essential (primary) hypertension: Secondary | ICD-10-CM | POA: Diagnosis not present

## 2016-04-07 DIAGNOSIS — Z7984 Long term (current) use of oral hypoglycemic drugs: Secondary | ICD-10-CM | POA: Diagnosis not present

## 2016-04-07 DIAGNOSIS — C3490 Malignant neoplasm of unspecified part of unspecified bronchus or lung: Secondary | ICD-10-CM | POA: Diagnosis not present

## 2016-04-07 DIAGNOSIS — M109 Gout, unspecified: Secondary | ICD-10-CM | POA: Diagnosis not present

## 2016-04-07 DIAGNOSIS — Z931 Gastrostomy status: Secondary | ICD-10-CM | POA: Diagnosis not present

## 2016-04-09 DIAGNOSIS — Z931 Gastrostomy status: Secondary | ICD-10-CM | POA: Diagnosis not present

## 2016-04-09 DIAGNOSIS — R0602 Shortness of breath: Secondary | ICD-10-CM | POA: Diagnosis not present

## 2016-04-09 DIAGNOSIS — Z87891 Personal history of nicotine dependence: Secondary | ICD-10-CM | POA: Diagnosis not present

## 2016-04-09 DIAGNOSIS — R2689 Other abnormalities of gait and mobility: Secondary | ICD-10-CM | POA: Diagnosis not present

## 2016-04-09 DIAGNOSIS — S40911D Unspecified superficial injury of right shoulder, subsequent encounter: Secondary | ICD-10-CM | POA: Diagnosis not present

## 2016-04-09 DIAGNOSIS — R1310 Dysphagia, unspecified: Secondary | ICD-10-CM | POA: Diagnosis not present

## 2016-04-09 DIAGNOSIS — C3412 Malignant neoplasm of upper lobe, left bronchus or lung: Secondary | ICD-10-CM | POA: Diagnosis not present

## 2016-04-09 DIAGNOSIS — Z7984 Long term (current) use of oral hypoglycemic drugs: Secondary | ICD-10-CM | POA: Diagnosis not present

## 2016-04-09 DIAGNOSIS — I10 Essential (primary) hypertension: Secondary | ICD-10-CM | POA: Diagnosis not present

## 2016-04-09 DIAGNOSIS — M109 Gout, unspecified: Secondary | ICD-10-CM | POA: Diagnosis not present

## 2016-04-09 DIAGNOSIS — C3490 Malignant neoplasm of unspecified part of unspecified bronchus or lung: Secondary | ICD-10-CM | POA: Diagnosis not present

## 2016-04-09 DIAGNOSIS — E119 Type 2 diabetes mellitus without complications: Secondary | ICD-10-CM | POA: Diagnosis not present

## 2016-04-10 DIAGNOSIS — R600 Localized edema: Secondary | ICD-10-CM | POA: Diagnosis not present

## 2016-04-10 DIAGNOSIS — I4892 Unspecified atrial flutter: Secondary | ICD-10-CM | POA: Diagnosis not present

## 2016-04-10 DIAGNOSIS — J449 Chronic obstructive pulmonary disease, unspecified: Secondary | ICD-10-CM | POA: Diagnosis not present

## 2016-04-10 DIAGNOSIS — Z931 Gastrostomy status: Secondary | ICD-10-CM | POA: Diagnosis not present

## 2016-04-10 DIAGNOSIS — Z923 Personal history of irradiation: Secondary | ICD-10-CM | POA: Diagnosis not present

## 2016-04-10 DIAGNOSIS — Z9981 Dependence on supplemental oxygen: Secondary | ICD-10-CM | POA: Diagnosis not present

## 2016-04-10 DIAGNOSIS — C3412 Malignant neoplasm of upper lobe, left bronchus or lung: Secondary | ICD-10-CM | POA: Diagnosis not present

## 2016-04-11 DIAGNOSIS — R2689 Other abnormalities of gait and mobility: Secondary | ICD-10-CM | POA: Diagnosis not present

## 2016-04-11 DIAGNOSIS — Z931 Gastrostomy status: Secondary | ICD-10-CM | POA: Diagnosis not present

## 2016-04-11 DIAGNOSIS — Z87891 Personal history of nicotine dependence: Secondary | ICD-10-CM | POA: Diagnosis not present

## 2016-04-11 DIAGNOSIS — S40911D Unspecified superficial injury of right shoulder, subsequent encounter: Secondary | ICD-10-CM | POA: Diagnosis not present

## 2016-04-11 DIAGNOSIS — I10 Essential (primary) hypertension: Secondary | ICD-10-CM | POA: Diagnosis not present

## 2016-04-11 DIAGNOSIS — Z7984 Long term (current) use of oral hypoglycemic drugs: Secondary | ICD-10-CM | POA: Diagnosis not present

## 2016-04-11 DIAGNOSIS — M109 Gout, unspecified: Secondary | ICD-10-CM | POA: Diagnosis not present

## 2016-04-11 DIAGNOSIS — C3490 Malignant neoplasm of unspecified part of unspecified bronchus or lung: Secondary | ICD-10-CM | POA: Diagnosis not present

## 2016-04-11 DIAGNOSIS — E119 Type 2 diabetes mellitus without complications: Secondary | ICD-10-CM | POA: Diagnosis not present

## 2016-04-14 DIAGNOSIS — Z931 Gastrostomy status: Secondary | ICD-10-CM | POA: Diagnosis not present

## 2016-04-14 DIAGNOSIS — S40911D Unspecified superficial injury of right shoulder, subsequent encounter: Secondary | ICD-10-CM | POA: Diagnosis not present

## 2016-04-14 DIAGNOSIS — R2689 Other abnormalities of gait and mobility: Secondary | ICD-10-CM | POA: Diagnosis not present

## 2016-04-14 DIAGNOSIS — Z87891 Personal history of nicotine dependence: Secondary | ICD-10-CM | POA: Diagnosis not present

## 2016-04-14 DIAGNOSIS — Z7984 Long term (current) use of oral hypoglycemic drugs: Secondary | ICD-10-CM | POA: Diagnosis not present

## 2016-04-14 DIAGNOSIS — I10 Essential (primary) hypertension: Secondary | ICD-10-CM | POA: Diagnosis not present

## 2016-04-14 DIAGNOSIS — M109 Gout, unspecified: Secondary | ICD-10-CM | POA: Diagnosis not present

## 2016-04-14 DIAGNOSIS — E119 Type 2 diabetes mellitus without complications: Secondary | ICD-10-CM | POA: Diagnosis not present

## 2016-04-14 DIAGNOSIS — C3490 Malignant neoplasm of unspecified part of unspecified bronchus or lung: Secondary | ICD-10-CM | POA: Diagnosis not present

## 2016-04-17 DIAGNOSIS — R2689 Other abnormalities of gait and mobility: Secondary | ICD-10-CM | POA: Diagnosis not present

## 2016-04-17 DIAGNOSIS — E119 Type 2 diabetes mellitus without complications: Secondary | ICD-10-CM | POA: Diagnosis not present

## 2016-04-17 DIAGNOSIS — M109 Gout, unspecified: Secondary | ICD-10-CM | POA: Diagnosis not present

## 2016-04-17 DIAGNOSIS — C3490 Malignant neoplasm of unspecified part of unspecified bronchus or lung: Secondary | ICD-10-CM | POA: Diagnosis not present

## 2016-04-17 DIAGNOSIS — S40911D Unspecified superficial injury of right shoulder, subsequent encounter: Secondary | ICD-10-CM | POA: Diagnosis not present

## 2016-04-17 DIAGNOSIS — Z7984 Long term (current) use of oral hypoglycemic drugs: Secondary | ICD-10-CM | POA: Diagnosis not present

## 2016-04-17 DIAGNOSIS — Z87891 Personal history of nicotine dependence: Secondary | ICD-10-CM | POA: Diagnosis not present

## 2016-04-17 DIAGNOSIS — I10 Essential (primary) hypertension: Secondary | ICD-10-CM | POA: Diagnosis not present

## 2016-04-17 DIAGNOSIS — R64 Cachexia: Secondary | ICD-10-CM | POA: Diagnosis not present

## 2016-04-17 DIAGNOSIS — Z931 Gastrostomy status: Secondary | ICD-10-CM | POA: Diagnosis not present

## 2016-04-22 DIAGNOSIS — E119 Type 2 diabetes mellitus without complications: Secondary | ICD-10-CM | POA: Diagnosis not present

## 2016-04-22 DIAGNOSIS — I10 Essential (primary) hypertension: Secondary | ICD-10-CM | POA: Diagnosis not present

## 2016-04-22 DIAGNOSIS — S40911D Unspecified superficial injury of right shoulder, subsequent encounter: Secondary | ICD-10-CM | POA: Diagnosis not present

## 2016-04-22 DIAGNOSIS — Z7984 Long term (current) use of oral hypoglycemic drugs: Secondary | ICD-10-CM | POA: Diagnosis not present

## 2016-04-22 DIAGNOSIS — C3490 Malignant neoplasm of unspecified part of unspecified bronchus or lung: Secondary | ICD-10-CM | POA: Diagnosis not present

## 2016-04-22 DIAGNOSIS — Z931 Gastrostomy status: Secondary | ICD-10-CM | POA: Diagnosis not present

## 2016-04-22 DIAGNOSIS — M109 Gout, unspecified: Secondary | ICD-10-CM | POA: Diagnosis not present

## 2016-04-22 DIAGNOSIS — Z87891 Personal history of nicotine dependence: Secondary | ICD-10-CM | POA: Diagnosis not present

## 2016-04-22 DIAGNOSIS — R2689 Other abnormalities of gait and mobility: Secondary | ICD-10-CM | POA: Diagnosis not present

## 2016-04-24 DIAGNOSIS — R2689 Other abnormalities of gait and mobility: Secondary | ICD-10-CM | POA: Diagnosis not present

## 2016-04-24 DIAGNOSIS — E119 Type 2 diabetes mellitus without complications: Secondary | ICD-10-CM | POA: Diagnosis not present

## 2016-04-24 DIAGNOSIS — S40911D Unspecified superficial injury of right shoulder, subsequent encounter: Secondary | ICD-10-CM | POA: Diagnosis not present

## 2016-04-24 DIAGNOSIS — C3490 Malignant neoplasm of unspecified part of unspecified bronchus or lung: Secondary | ICD-10-CM | POA: Diagnosis not present

## 2016-04-24 DIAGNOSIS — Z931 Gastrostomy status: Secondary | ICD-10-CM | POA: Diagnosis not present

## 2016-04-24 DIAGNOSIS — Z7984 Long term (current) use of oral hypoglycemic drugs: Secondary | ICD-10-CM | POA: Diagnosis not present

## 2016-04-24 DIAGNOSIS — I10 Essential (primary) hypertension: Secondary | ICD-10-CM | POA: Diagnosis not present

## 2016-04-24 DIAGNOSIS — M109 Gout, unspecified: Secondary | ICD-10-CM | POA: Diagnosis not present

## 2016-04-24 DIAGNOSIS — Z87891 Personal history of nicotine dependence: Secondary | ICD-10-CM | POA: Diagnosis not present

## 2016-04-25 DIAGNOSIS — Z931 Gastrostomy status: Secondary | ICD-10-CM | POA: Diagnosis not present

## 2016-04-25 DIAGNOSIS — C3490 Malignant neoplasm of unspecified part of unspecified bronchus or lung: Secondary | ICD-10-CM | POA: Diagnosis not present

## 2016-04-25 DIAGNOSIS — S40911D Unspecified superficial injury of right shoulder, subsequent encounter: Secondary | ICD-10-CM | POA: Diagnosis not present

## 2016-04-25 DIAGNOSIS — R2689 Other abnormalities of gait and mobility: Secondary | ICD-10-CM | POA: Diagnosis not present

## 2016-04-25 DIAGNOSIS — E119 Type 2 diabetes mellitus without complications: Secondary | ICD-10-CM | POA: Diagnosis not present

## 2016-04-25 DIAGNOSIS — Z7984 Long term (current) use of oral hypoglycemic drugs: Secondary | ICD-10-CM | POA: Diagnosis not present

## 2016-04-25 DIAGNOSIS — Z87891 Personal history of nicotine dependence: Secondary | ICD-10-CM | POA: Diagnosis not present

## 2016-04-25 DIAGNOSIS — M109 Gout, unspecified: Secondary | ICD-10-CM | POA: Diagnosis not present

## 2016-04-25 DIAGNOSIS — I10 Essential (primary) hypertension: Secondary | ICD-10-CM | POA: Diagnosis not present

## 2016-04-28 DIAGNOSIS — I10 Essential (primary) hypertension: Secondary | ICD-10-CM | POA: Diagnosis not present

## 2016-04-28 DIAGNOSIS — M109 Gout, unspecified: Secondary | ICD-10-CM | POA: Diagnosis not present

## 2016-04-28 DIAGNOSIS — Z931 Gastrostomy status: Secondary | ICD-10-CM | POA: Diagnosis not present

## 2016-04-28 DIAGNOSIS — Z87891 Personal history of nicotine dependence: Secondary | ICD-10-CM | POA: Diagnosis not present

## 2016-04-28 DIAGNOSIS — R2689 Other abnormalities of gait and mobility: Secondary | ICD-10-CM | POA: Diagnosis not present

## 2016-04-28 DIAGNOSIS — E119 Type 2 diabetes mellitus without complications: Secondary | ICD-10-CM | POA: Diagnosis not present

## 2016-04-28 DIAGNOSIS — S40911D Unspecified superficial injury of right shoulder, subsequent encounter: Secondary | ICD-10-CM | POA: Diagnosis not present

## 2016-04-28 DIAGNOSIS — C3490 Malignant neoplasm of unspecified part of unspecified bronchus or lung: Secondary | ICD-10-CM | POA: Diagnosis not present

## 2016-04-28 DIAGNOSIS — Z7984 Long term (current) use of oral hypoglycemic drugs: Secondary | ICD-10-CM | POA: Diagnosis not present

## 2016-04-29 DIAGNOSIS — R42 Dizziness and giddiness: Secondary | ICD-10-CM | POA: Diagnosis not present

## 2016-04-29 DIAGNOSIS — I420 Dilated cardiomyopathy: Secondary | ICD-10-CM | POA: Diagnosis not present

## 2016-04-29 DIAGNOSIS — J449 Chronic obstructive pulmonary disease, unspecified: Secondary | ICD-10-CM | POA: Diagnosis not present

## 2016-04-29 DIAGNOSIS — Z7189 Other specified counseling: Secondary | ICD-10-CM | POA: Diagnosis not present

## 2016-04-29 DIAGNOSIS — I4892 Unspecified atrial flutter: Secondary | ICD-10-CM | POA: Diagnosis not present

## 2016-04-29 DIAGNOSIS — Z7901 Long term (current) use of anticoagulants: Secondary | ICD-10-CM | POA: Diagnosis not present

## 2016-04-29 DIAGNOSIS — I739 Peripheral vascular disease, unspecified: Secondary | ICD-10-CM | POA: Diagnosis not present

## 2016-04-30 DIAGNOSIS — Z87891 Personal history of nicotine dependence: Secondary | ICD-10-CM | POA: Diagnosis not present

## 2016-04-30 DIAGNOSIS — R2689 Other abnormalities of gait and mobility: Secondary | ICD-10-CM | POA: Diagnosis not present

## 2016-04-30 DIAGNOSIS — Z931 Gastrostomy status: Secondary | ICD-10-CM | POA: Diagnosis not present

## 2016-04-30 DIAGNOSIS — S40911D Unspecified superficial injury of right shoulder, subsequent encounter: Secondary | ICD-10-CM | POA: Diagnosis not present

## 2016-04-30 DIAGNOSIS — E119 Type 2 diabetes mellitus without complications: Secondary | ICD-10-CM | POA: Diagnosis not present

## 2016-04-30 DIAGNOSIS — M109 Gout, unspecified: Secondary | ICD-10-CM | POA: Diagnosis not present

## 2016-04-30 DIAGNOSIS — Z7984 Long term (current) use of oral hypoglycemic drugs: Secondary | ICD-10-CM | POA: Diagnosis not present

## 2016-04-30 DIAGNOSIS — C3490 Malignant neoplasm of unspecified part of unspecified bronchus or lung: Secondary | ICD-10-CM | POA: Diagnosis not present

## 2016-04-30 DIAGNOSIS — I10 Essential (primary) hypertension: Secondary | ICD-10-CM | POA: Diagnosis not present

## 2016-05-01 DIAGNOSIS — S40911D Unspecified superficial injury of right shoulder, subsequent encounter: Secondary | ICD-10-CM | POA: Diagnosis not present

## 2016-05-01 DIAGNOSIS — R2689 Other abnormalities of gait and mobility: Secondary | ICD-10-CM | POA: Diagnosis not present

## 2016-05-01 DIAGNOSIS — M109 Gout, unspecified: Secondary | ICD-10-CM | POA: Diagnosis not present

## 2016-05-01 DIAGNOSIS — Z931 Gastrostomy status: Secondary | ICD-10-CM | POA: Diagnosis not present

## 2016-05-01 DIAGNOSIS — Z87891 Personal history of nicotine dependence: Secondary | ICD-10-CM | POA: Diagnosis not present

## 2016-05-01 DIAGNOSIS — E119 Type 2 diabetes mellitus without complications: Secondary | ICD-10-CM | POA: Diagnosis not present

## 2016-05-01 DIAGNOSIS — I10 Essential (primary) hypertension: Secondary | ICD-10-CM | POA: Diagnosis not present

## 2016-05-01 DIAGNOSIS — C3490 Malignant neoplasm of unspecified part of unspecified bronchus or lung: Secondary | ICD-10-CM | POA: Diagnosis not present

## 2016-05-01 DIAGNOSIS — Z7984 Long term (current) use of oral hypoglycemic drugs: Secondary | ICD-10-CM | POA: Diagnosis not present

## 2016-05-05 DIAGNOSIS — Z7984 Long term (current) use of oral hypoglycemic drugs: Secondary | ICD-10-CM | POA: Diagnosis not present

## 2016-05-05 DIAGNOSIS — C3490 Malignant neoplasm of unspecified part of unspecified bronchus or lung: Secondary | ICD-10-CM | POA: Diagnosis not present

## 2016-05-05 DIAGNOSIS — R2689 Other abnormalities of gait and mobility: Secondary | ICD-10-CM | POA: Diagnosis not present

## 2016-05-05 DIAGNOSIS — I10 Essential (primary) hypertension: Secondary | ICD-10-CM | POA: Diagnosis not present

## 2016-05-05 DIAGNOSIS — M109 Gout, unspecified: Secondary | ICD-10-CM | POA: Diagnosis not present

## 2016-05-05 DIAGNOSIS — E119 Type 2 diabetes mellitus without complications: Secondary | ICD-10-CM | POA: Diagnosis not present

## 2016-05-05 DIAGNOSIS — S40911D Unspecified superficial injury of right shoulder, subsequent encounter: Secondary | ICD-10-CM | POA: Diagnosis not present

## 2016-05-05 DIAGNOSIS — Z931 Gastrostomy status: Secondary | ICD-10-CM | POA: Diagnosis not present

## 2016-05-05 DIAGNOSIS — Z87891 Personal history of nicotine dependence: Secondary | ICD-10-CM | POA: Diagnosis not present

## 2016-05-06 DIAGNOSIS — C3490 Malignant neoplasm of unspecified part of unspecified bronchus or lung: Secondary | ICD-10-CM | POA: Diagnosis not present

## 2016-05-06 DIAGNOSIS — S40911D Unspecified superficial injury of right shoulder, subsequent encounter: Secondary | ICD-10-CM | POA: Diagnosis not present

## 2016-05-06 DIAGNOSIS — Z87891 Personal history of nicotine dependence: Secondary | ICD-10-CM | POA: Diagnosis not present

## 2016-05-06 DIAGNOSIS — I10 Essential (primary) hypertension: Secondary | ICD-10-CM | POA: Diagnosis not present

## 2016-05-06 DIAGNOSIS — Z931 Gastrostomy status: Secondary | ICD-10-CM | POA: Diagnosis not present

## 2016-05-06 DIAGNOSIS — E119 Type 2 diabetes mellitus without complications: Secondary | ICD-10-CM | POA: Diagnosis not present

## 2016-05-06 DIAGNOSIS — M109 Gout, unspecified: Secondary | ICD-10-CM | POA: Diagnosis not present

## 2016-05-06 DIAGNOSIS — R2689 Other abnormalities of gait and mobility: Secondary | ICD-10-CM | POA: Diagnosis not present

## 2016-05-06 DIAGNOSIS — Z7984 Long term (current) use of oral hypoglycemic drugs: Secondary | ICD-10-CM | POA: Diagnosis not present

## 2016-05-07 DIAGNOSIS — J449 Chronic obstructive pulmonary disease, unspecified: Secondary | ICD-10-CM | POA: Diagnosis not present

## 2016-05-07 DIAGNOSIS — Z7901 Long term (current) use of anticoagulants: Secondary | ICD-10-CM | POA: Diagnosis not present

## 2016-05-07 DIAGNOSIS — I420 Dilated cardiomyopathy: Secondary | ICD-10-CM | POA: Diagnosis not present

## 2016-05-07 DIAGNOSIS — I4892 Unspecified atrial flutter: Secondary | ICD-10-CM | POA: Diagnosis not present

## 2016-05-08 DIAGNOSIS — J449 Chronic obstructive pulmonary disease, unspecified: Secondary | ICD-10-CM | POA: Diagnosis not present

## 2016-05-08 DIAGNOSIS — K922 Gastrointestinal hemorrhage, unspecified: Secondary | ICD-10-CM | POA: Diagnosis not present

## 2016-05-08 DIAGNOSIS — Z7984 Long term (current) use of oral hypoglycemic drugs: Secondary | ICD-10-CM | POA: Diagnosis not present

## 2016-05-08 DIAGNOSIS — Z9981 Dependence on supplemental oxygen: Secondary | ICD-10-CM | POA: Diagnosis not present

## 2016-05-08 DIAGNOSIS — D649 Anemia, unspecified: Secondary | ICD-10-CM | POA: Diagnosis not present

## 2016-05-08 DIAGNOSIS — J811 Chronic pulmonary edema: Secondary | ICD-10-CM | POA: Diagnosis not present

## 2016-05-08 DIAGNOSIS — R031 Nonspecific low blood-pressure reading: Secondary | ICD-10-CM | POA: Diagnosis not present

## 2016-05-08 DIAGNOSIS — Z931 Gastrostomy status: Secondary | ICD-10-CM | POA: Diagnosis not present

## 2016-05-08 DIAGNOSIS — E0590 Thyrotoxicosis, unspecified without thyrotoxic crisis or storm: Secondary | ICD-10-CM | POA: Diagnosis not present

## 2016-05-08 DIAGNOSIS — K921 Melena: Secondary | ICD-10-CM | POA: Diagnosis not present

## 2016-05-08 DIAGNOSIS — C3490 Malignant neoplasm of unspecified part of unspecified bronchus or lung: Secondary | ICD-10-CM | POA: Diagnosis not present

## 2016-05-08 DIAGNOSIS — C3412 Malignant neoplasm of upper lobe, left bronchus or lung: Secondary | ICD-10-CM | POA: Diagnosis not present

## 2016-05-08 DIAGNOSIS — J90 Pleural effusion, not elsewhere classified: Secondary | ICD-10-CM | POA: Diagnosis not present

## 2016-05-08 DIAGNOSIS — I110 Hypertensive heart disease with heart failure: Secondary | ICD-10-CM | POA: Diagnosis not present

## 2016-05-08 DIAGNOSIS — I4892 Unspecified atrial flutter: Secondary | ICD-10-CM | POA: Diagnosis not present

## 2016-05-08 DIAGNOSIS — I959 Hypotension, unspecified: Secondary | ICD-10-CM | POA: Diagnosis not present

## 2016-05-08 DIAGNOSIS — M109 Gout, unspecified: Secondary | ICD-10-CM | POA: Diagnosis not present

## 2016-05-08 DIAGNOSIS — D62 Acute posthemorrhagic anemia: Secondary | ICD-10-CM | POA: Diagnosis not present

## 2016-05-08 DIAGNOSIS — R918 Other nonspecific abnormal finding of lung field: Secondary | ICD-10-CM | POA: Diagnosis not present

## 2016-05-08 DIAGNOSIS — I5022 Chronic systolic (congestive) heart failure: Secondary | ICD-10-CM | POA: Diagnosis not present

## 2016-05-08 DIAGNOSIS — Z87891 Personal history of nicotine dependence: Secondary | ICD-10-CM | POA: Diagnosis not present

## 2016-05-08 DIAGNOSIS — Z7901 Long term (current) use of anticoagulants: Secondary | ICD-10-CM | POA: Diagnosis not present

## 2016-05-08 DIAGNOSIS — E119 Type 2 diabetes mellitus without complications: Secondary | ICD-10-CM | POA: Diagnosis not present

## 2016-05-08 DIAGNOSIS — R531 Weakness: Secondary | ICD-10-CM | POA: Diagnosis not present

## 2016-05-08 DIAGNOSIS — S40911D Unspecified superficial injury of right shoulder, subsequent encounter: Secondary | ICD-10-CM | POA: Diagnosis not present

## 2016-05-08 DIAGNOSIS — R2689 Other abnormalities of gait and mobility: Secondary | ICD-10-CM | POA: Diagnosis not present

## 2016-05-08 DIAGNOSIS — I10 Essential (primary) hypertension: Secondary | ICD-10-CM | POA: Diagnosis not present

## 2016-05-08 DIAGNOSIS — J9611 Chronic respiratory failure with hypoxia: Secondary | ICD-10-CM | POA: Diagnosis not present

## 2016-05-08 DIAGNOSIS — D500 Iron deficiency anemia secondary to blood loss (chronic): Secondary | ICD-10-CM | POA: Diagnosis not present

## 2016-05-08 DIAGNOSIS — Z48815 Encounter for surgical aftercare following surgery on the digestive system: Secondary | ICD-10-CM | POA: Diagnosis not present

## 2016-05-08 DIAGNOSIS — I9589 Other hypotension: Secondary | ICD-10-CM | POA: Diagnosis not present

## 2016-05-13 DIAGNOSIS — R2689 Other abnormalities of gait and mobility: Secondary | ICD-10-CM | POA: Diagnosis not present

## 2016-05-13 DIAGNOSIS — I10 Essential (primary) hypertension: Secondary | ICD-10-CM | POA: Diagnosis not present

## 2016-05-13 DIAGNOSIS — Z931 Gastrostomy status: Secondary | ICD-10-CM | POA: Diagnosis not present

## 2016-05-13 DIAGNOSIS — S40911D Unspecified superficial injury of right shoulder, subsequent encounter: Secondary | ICD-10-CM | POA: Diagnosis not present

## 2016-05-13 DIAGNOSIS — M109 Gout, unspecified: Secondary | ICD-10-CM | POA: Diagnosis not present

## 2016-05-13 DIAGNOSIS — Z7984 Long term (current) use of oral hypoglycemic drugs: Secondary | ICD-10-CM | POA: Diagnosis not present

## 2016-05-13 DIAGNOSIS — E119 Type 2 diabetes mellitus without complications: Secondary | ICD-10-CM | POA: Diagnosis not present

## 2016-05-13 DIAGNOSIS — C3490 Malignant neoplasm of unspecified part of unspecified bronchus or lung: Secondary | ICD-10-CM | POA: Diagnosis not present

## 2016-05-13 DIAGNOSIS — Z87891 Personal history of nicotine dependence: Secondary | ICD-10-CM | POA: Diagnosis not present

## 2016-05-15 DIAGNOSIS — R2689 Other abnormalities of gait and mobility: Secondary | ICD-10-CM | POA: Diagnosis not present

## 2016-05-15 DIAGNOSIS — I10 Essential (primary) hypertension: Secondary | ICD-10-CM | POA: Diagnosis not present

## 2016-05-15 DIAGNOSIS — Z931 Gastrostomy status: Secondary | ICD-10-CM | POA: Diagnosis not present

## 2016-05-15 DIAGNOSIS — Z87891 Personal history of nicotine dependence: Secondary | ICD-10-CM | POA: Diagnosis not present

## 2016-05-15 DIAGNOSIS — S40911D Unspecified superficial injury of right shoulder, subsequent encounter: Secondary | ICD-10-CM | POA: Diagnosis not present

## 2016-05-15 DIAGNOSIS — C3490 Malignant neoplasm of unspecified part of unspecified bronchus or lung: Secondary | ICD-10-CM | POA: Diagnosis not present

## 2016-05-15 DIAGNOSIS — E119 Type 2 diabetes mellitus without complications: Secondary | ICD-10-CM | POA: Diagnosis not present

## 2016-05-15 DIAGNOSIS — M109 Gout, unspecified: Secondary | ICD-10-CM | POA: Diagnosis not present

## 2016-05-15 DIAGNOSIS — Z7984 Long term (current) use of oral hypoglycemic drugs: Secondary | ICD-10-CM | POA: Diagnosis not present

## 2016-05-16 DIAGNOSIS — D500 Iron deficiency anemia secondary to blood loss (chronic): Secondary | ICD-10-CM | POA: Diagnosis not present

## 2016-05-16 DIAGNOSIS — D509 Iron deficiency anemia, unspecified: Secondary | ICD-10-CM | POA: Diagnosis not present

## 2016-05-16 DIAGNOSIS — I498 Other specified cardiac arrhythmias: Secondary | ICD-10-CM | POA: Diagnosis not present

## 2016-05-16 DIAGNOSIS — K922 Gastrointestinal hemorrhage, unspecified: Secondary | ICD-10-CM | POA: Diagnosis not present

## 2016-05-16 DIAGNOSIS — J449 Chronic obstructive pulmonary disease, unspecified: Secondary | ICD-10-CM | POA: Diagnosis not present

## 2016-05-17 DIAGNOSIS — R64 Cachexia: Secondary | ICD-10-CM | POA: Diagnosis not present

## 2016-05-18 DIAGNOSIS — S40911D Unspecified superficial injury of right shoulder, subsequent encounter: Secondary | ICD-10-CM | POA: Diagnosis not present

## 2016-05-18 DIAGNOSIS — E119 Type 2 diabetes mellitus without complications: Secondary | ICD-10-CM | POA: Diagnosis not present

## 2016-05-18 DIAGNOSIS — R2689 Other abnormalities of gait and mobility: Secondary | ICD-10-CM | POA: Diagnosis not present

## 2016-05-18 DIAGNOSIS — Z7984 Long term (current) use of oral hypoglycemic drugs: Secondary | ICD-10-CM | POA: Diagnosis not present

## 2016-05-18 DIAGNOSIS — M109 Gout, unspecified: Secondary | ICD-10-CM | POA: Diagnosis not present

## 2016-05-18 DIAGNOSIS — C3490 Malignant neoplasm of unspecified part of unspecified bronchus or lung: Secondary | ICD-10-CM | POA: Diagnosis not present

## 2016-05-18 DIAGNOSIS — Z931 Gastrostomy status: Secondary | ICD-10-CM | POA: Diagnosis not present

## 2016-05-18 DIAGNOSIS — I10 Essential (primary) hypertension: Secondary | ICD-10-CM | POA: Diagnosis not present

## 2016-05-18 DIAGNOSIS — Z87891 Personal history of nicotine dependence: Secondary | ICD-10-CM | POA: Diagnosis not present

## 2016-05-20 DIAGNOSIS — K922 Gastrointestinal hemorrhage, unspecified: Secondary | ICD-10-CM | POA: Diagnosis not present

## 2016-05-20 DIAGNOSIS — E119 Type 2 diabetes mellitus without complications: Secondary | ICD-10-CM | POA: Diagnosis not present

## 2016-05-20 DIAGNOSIS — R1310 Dysphagia, unspecified: Secondary | ICD-10-CM | POA: Diagnosis not present

## 2016-05-20 DIAGNOSIS — Z87891 Personal history of nicotine dependence: Secondary | ICD-10-CM | POA: Diagnosis not present

## 2016-05-20 DIAGNOSIS — Z7984 Long term (current) use of oral hypoglycemic drugs: Secondary | ICD-10-CM | POA: Diagnosis not present

## 2016-05-20 DIAGNOSIS — M109 Gout, unspecified: Secondary | ICD-10-CM | POA: Diagnosis not present

## 2016-05-20 DIAGNOSIS — I10 Essential (primary) hypertension: Secondary | ICD-10-CM | POA: Diagnosis not present

## 2016-05-20 DIAGNOSIS — R2689 Other abnormalities of gait and mobility: Secondary | ICD-10-CM | POA: Diagnosis not present

## 2016-05-20 DIAGNOSIS — J441 Chronic obstructive pulmonary disease with (acute) exacerbation: Secondary | ICD-10-CM | POA: Diagnosis not present

## 2016-05-20 DIAGNOSIS — C3412 Malignant neoplasm of upper lobe, left bronchus or lung: Secondary | ICD-10-CM | POA: Diagnosis not present

## 2016-05-20 DIAGNOSIS — R0602 Shortness of breath: Secondary | ICD-10-CM | POA: Diagnosis not present

## 2016-05-22 DIAGNOSIS — E876 Hypokalemia: Secondary | ICD-10-CM | POA: Diagnosis not present

## 2016-05-22 DIAGNOSIS — K8080 Other cholelithiasis without obstruction: Secondary | ICD-10-CM | POA: Diagnosis not present

## 2016-05-22 DIAGNOSIS — Z9981 Dependence on supplemental oxygen: Secondary | ICD-10-CM | POA: Diagnosis not present

## 2016-05-22 DIAGNOSIS — Z923 Personal history of irradiation: Secondary | ICD-10-CM | POA: Diagnosis not present

## 2016-05-22 DIAGNOSIS — J90 Pleural effusion, not elsewhere classified: Secondary | ICD-10-CM | POA: Diagnosis not present

## 2016-05-22 DIAGNOSIS — J9611 Chronic respiratory failure with hypoxia: Secondary | ICD-10-CM | POA: Diagnosis not present

## 2016-05-22 DIAGNOSIS — R1310 Dysphagia, unspecified: Secondary | ICD-10-CM | POA: Diagnosis not present

## 2016-05-22 DIAGNOSIS — E119 Type 2 diabetes mellitus without complications: Secondary | ICD-10-CM | POA: Diagnosis not present

## 2016-05-22 DIAGNOSIS — D72810 Lymphocytopenia: Secondary | ICD-10-CM | POA: Diagnosis not present

## 2016-05-22 DIAGNOSIS — Z931 Gastrostomy status: Secondary | ICD-10-CM | POA: Diagnosis not present

## 2016-05-22 DIAGNOSIS — K9423 Gastrostomy malfunction: Secondary | ICD-10-CM | POA: Diagnosis not present

## 2016-05-22 DIAGNOSIS — C3412 Malignant neoplasm of upper lobe, left bronchus or lung: Secondary | ICD-10-CM | POA: Diagnosis not present

## 2016-05-22 DIAGNOSIS — R600 Localized edema: Secondary | ICD-10-CM | POA: Diagnosis not present

## 2016-05-22 DIAGNOSIS — K922 Gastrointestinal hemorrhage, unspecified: Secondary | ICD-10-CM | POA: Diagnosis not present

## 2016-05-22 DIAGNOSIS — I483 Typical atrial flutter: Secondary | ICD-10-CM | POA: Diagnosis not present

## 2016-05-22 DIAGNOSIS — I272 Pulmonary hypertension, unspecified: Secondary | ICD-10-CM | POA: Diagnosis not present

## 2016-05-23 DIAGNOSIS — I10 Essential (primary) hypertension: Secondary | ICD-10-CM | POA: Diagnosis not present

## 2016-05-23 DIAGNOSIS — Z87891 Personal history of nicotine dependence: Secondary | ICD-10-CM | POA: Diagnosis not present

## 2016-05-23 DIAGNOSIS — R2689 Other abnormalities of gait and mobility: Secondary | ICD-10-CM | POA: Diagnosis not present

## 2016-05-23 DIAGNOSIS — K922 Gastrointestinal hemorrhage, unspecified: Secondary | ICD-10-CM | POA: Diagnosis not present

## 2016-05-23 DIAGNOSIS — J441 Chronic obstructive pulmonary disease with (acute) exacerbation: Secondary | ICD-10-CM | POA: Diagnosis not present

## 2016-05-23 DIAGNOSIS — C3412 Malignant neoplasm of upper lobe, left bronchus or lung: Secondary | ICD-10-CM | POA: Diagnosis not present

## 2016-05-23 DIAGNOSIS — M109 Gout, unspecified: Secondary | ICD-10-CM | POA: Diagnosis not present

## 2016-05-23 DIAGNOSIS — E119 Type 2 diabetes mellitus without complications: Secondary | ICD-10-CM | POA: Diagnosis not present

## 2016-05-23 DIAGNOSIS — Z7984 Long term (current) use of oral hypoglycemic drugs: Secondary | ICD-10-CM | POA: Diagnosis not present

## 2016-05-28 DIAGNOSIS — Z7984 Long term (current) use of oral hypoglycemic drugs: Secondary | ICD-10-CM | POA: Diagnosis not present

## 2016-05-28 DIAGNOSIS — Z87891 Personal history of nicotine dependence: Secondary | ICD-10-CM | POA: Diagnosis not present

## 2016-05-28 DIAGNOSIS — C3412 Malignant neoplasm of upper lobe, left bronchus or lung: Secondary | ICD-10-CM | POA: Diagnosis not present

## 2016-05-28 DIAGNOSIS — E119 Type 2 diabetes mellitus without complications: Secondary | ICD-10-CM | POA: Diagnosis not present

## 2016-05-28 DIAGNOSIS — K922 Gastrointestinal hemorrhage, unspecified: Secondary | ICD-10-CM | POA: Diagnosis not present

## 2016-05-28 DIAGNOSIS — R2689 Other abnormalities of gait and mobility: Secondary | ICD-10-CM | POA: Diagnosis not present

## 2016-05-28 DIAGNOSIS — I10 Essential (primary) hypertension: Secondary | ICD-10-CM | POA: Diagnosis not present

## 2016-05-28 DIAGNOSIS — J441 Chronic obstructive pulmonary disease with (acute) exacerbation: Secondary | ICD-10-CM | POA: Diagnosis not present

## 2016-05-28 DIAGNOSIS — M109 Gout, unspecified: Secondary | ICD-10-CM | POA: Diagnosis not present

## 2016-05-30 DIAGNOSIS — I10 Essential (primary) hypertension: Secondary | ICD-10-CM | POA: Diagnosis not present

## 2016-05-30 DIAGNOSIS — Z87891 Personal history of nicotine dependence: Secondary | ICD-10-CM | POA: Diagnosis not present

## 2016-05-30 DIAGNOSIS — M109 Gout, unspecified: Secondary | ICD-10-CM | POA: Diagnosis not present

## 2016-05-30 DIAGNOSIS — J441 Chronic obstructive pulmonary disease with (acute) exacerbation: Secondary | ICD-10-CM | POA: Diagnosis not present

## 2016-05-30 DIAGNOSIS — K922 Gastrointestinal hemorrhage, unspecified: Secondary | ICD-10-CM | POA: Diagnosis not present

## 2016-05-30 DIAGNOSIS — R2689 Other abnormalities of gait and mobility: Secondary | ICD-10-CM | POA: Diagnosis not present

## 2016-05-30 DIAGNOSIS — C3412 Malignant neoplasm of upper lobe, left bronchus or lung: Secondary | ICD-10-CM | POA: Diagnosis not present

## 2016-05-30 DIAGNOSIS — Z7984 Long term (current) use of oral hypoglycemic drugs: Secondary | ICD-10-CM | POA: Diagnosis not present

## 2016-05-30 DIAGNOSIS — E119 Type 2 diabetes mellitus without complications: Secondary | ICD-10-CM | POA: Diagnosis not present

## 2016-06-02 DIAGNOSIS — C3412 Malignant neoplasm of upper lobe, left bronchus or lung: Secondary | ICD-10-CM | POA: Diagnosis not present

## 2016-06-02 DIAGNOSIS — J441 Chronic obstructive pulmonary disease with (acute) exacerbation: Secondary | ICD-10-CM | POA: Diagnosis not present

## 2016-06-02 DIAGNOSIS — E119 Type 2 diabetes mellitus without complications: Secondary | ICD-10-CM | POA: Diagnosis not present

## 2016-06-02 DIAGNOSIS — M109 Gout, unspecified: Secondary | ICD-10-CM | POA: Diagnosis not present

## 2016-06-02 DIAGNOSIS — I10 Essential (primary) hypertension: Secondary | ICD-10-CM | POA: Diagnosis not present

## 2016-06-02 DIAGNOSIS — Z87891 Personal history of nicotine dependence: Secondary | ICD-10-CM | POA: Diagnosis not present

## 2016-06-02 DIAGNOSIS — R2689 Other abnormalities of gait and mobility: Secondary | ICD-10-CM | POA: Diagnosis not present

## 2016-06-02 DIAGNOSIS — Z7984 Long term (current) use of oral hypoglycemic drugs: Secondary | ICD-10-CM | POA: Diagnosis not present

## 2016-06-02 DIAGNOSIS — K922 Gastrointestinal hemorrhage, unspecified: Secondary | ICD-10-CM | POA: Diagnosis not present

## 2016-06-04 DIAGNOSIS — M109 Gout, unspecified: Secondary | ICD-10-CM | POA: Diagnosis not present

## 2016-06-04 DIAGNOSIS — K922 Gastrointestinal hemorrhage, unspecified: Secondary | ICD-10-CM | POA: Diagnosis not present

## 2016-06-04 DIAGNOSIS — E119 Type 2 diabetes mellitus without complications: Secondary | ICD-10-CM | POA: Diagnosis not present

## 2016-06-04 DIAGNOSIS — C3412 Malignant neoplasm of upper lobe, left bronchus or lung: Secondary | ICD-10-CM | POA: Diagnosis not present

## 2016-06-04 DIAGNOSIS — Z7984 Long term (current) use of oral hypoglycemic drugs: Secondary | ICD-10-CM | POA: Diagnosis not present

## 2016-06-04 DIAGNOSIS — Z87891 Personal history of nicotine dependence: Secondary | ICD-10-CM | POA: Diagnosis not present

## 2016-06-04 DIAGNOSIS — R2689 Other abnormalities of gait and mobility: Secondary | ICD-10-CM | POA: Diagnosis not present

## 2016-06-04 DIAGNOSIS — J441 Chronic obstructive pulmonary disease with (acute) exacerbation: Secondary | ICD-10-CM | POA: Diagnosis not present

## 2016-06-04 DIAGNOSIS — I10 Essential (primary) hypertension: Secondary | ICD-10-CM | POA: Diagnosis not present

## 2016-06-05 DIAGNOSIS — E119 Type 2 diabetes mellitus without complications: Secondary | ICD-10-CM | POA: Diagnosis not present

## 2016-06-05 DIAGNOSIS — I10 Essential (primary) hypertension: Secondary | ICD-10-CM | POA: Diagnosis not present

## 2016-06-05 DIAGNOSIS — J441 Chronic obstructive pulmonary disease with (acute) exacerbation: Secondary | ICD-10-CM | POA: Diagnosis not present

## 2016-06-05 DIAGNOSIS — Z7984 Long term (current) use of oral hypoglycemic drugs: Secondary | ICD-10-CM | POA: Diagnosis not present

## 2016-06-05 DIAGNOSIS — M109 Gout, unspecified: Secondary | ICD-10-CM | POA: Diagnosis not present

## 2016-06-05 DIAGNOSIS — Z87891 Personal history of nicotine dependence: Secondary | ICD-10-CM | POA: Diagnosis not present

## 2016-06-05 DIAGNOSIS — C3412 Malignant neoplasm of upper lobe, left bronchus or lung: Secondary | ICD-10-CM | POA: Diagnosis not present

## 2016-06-05 DIAGNOSIS — R2689 Other abnormalities of gait and mobility: Secondary | ICD-10-CM | POA: Diagnosis not present

## 2016-06-05 DIAGNOSIS — K922 Gastrointestinal hemorrhage, unspecified: Secondary | ICD-10-CM | POA: Diagnosis not present

## 2016-06-07 DIAGNOSIS — R918 Other nonspecific abnormal finding of lung field: Secondary | ICD-10-CM | POA: Diagnosis not present

## 2016-06-09 DIAGNOSIS — E119 Type 2 diabetes mellitus without complications: Secondary | ICD-10-CM | POA: Diagnosis not present

## 2016-06-09 DIAGNOSIS — C3412 Malignant neoplasm of upper lobe, left bronchus or lung: Secondary | ICD-10-CM | POA: Diagnosis not present

## 2016-06-09 DIAGNOSIS — Z7984 Long term (current) use of oral hypoglycemic drugs: Secondary | ICD-10-CM | POA: Diagnosis not present

## 2016-06-09 DIAGNOSIS — I10 Essential (primary) hypertension: Secondary | ICD-10-CM | POA: Diagnosis not present

## 2016-06-09 DIAGNOSIS — K922 Gastrointestinal hemorrhage, unspecified: Secondary | ICD-10-CM | POA: Diagnosis not present

## 2016-06-09 DIAGNOSIS — J441 Chronic obstructive pulmonary disease with (acute) exacerbation: Secondary | ICD-10-CM | POA: Diagnosis not present

## 2016-06-09 DIAGNOSIS — Z87891 Personal history of nicotine dependence: Secondary | ICD-10-CM | POA: Diagnosis not present

## 2016-06-09 DIAGNOSIS — R2689 Other abnormalities of gait and mobility: Secondary | ICD-10-CM | POA: Diagnosis not present

## 2016-06-09 DIAGNOSIS — M109 Gout, unspecified: Secondary | ICD-10-CM | POA: Diagnosis not present

## 2016-06-11 DIAGNOSIS — R2689 Other abnormalities of gait and mobility: Secondary | ICD-10-CM | POA: Diagnosis not present

## 2016-06-11 DIAGNOSIS — M109 Gout, unspecified: Secondary | ICD-10-CM | POA: Diagnosis not present

## 2016-06-11 DIAGNOSIS — I10 Essential (primary) hypertension: Secondary | ICD-10-CM | POA: Diagnosis not present

## 2016-06-11 DIAGNOSIS — J441 Chronic obstructive pulmonary disease with (acute) exacerbation: Secondary | ICD-10-CM | POA: Diagnosis not present

## 2016-06-11 DIAGNOSIS — C3412 Malignant neoplasm of upper lobe, left bronchus or lung: Secondary | ICD-10-CM | POA: Diagnosis not present

## 2016-06-11 DIAGNOSIS — Z87891 Personal history of nicotine dependence: Secondary | ICD-10-CM | POA: Diagnosis not present

## 2016-06-11 DIAGNOSIS — K922 Gastrointestinal hemorrhage, unspecified: Secondary | ICD-10-CM | POA: Diagnosis not present

## 2016-06-11 DIAGNOSIS — E119 Type 2 diabetes mellitus without complications: Secondary | ICD-10-CM | POA: Diagnosis not present

## 2016-06-11 DIAGNOSIS — Z7984 Long term (current) use of oral hypoglycemic drugs: Secondary | ICD-10-CM | POA: Diagnosis not present

## 2016-06-12 DIAGNOSIS — C3412 Malignant neoplasm of upper lobe, left bronchus or lung: Secondary | ICD-10-CM | POA: Diagnosis not present

## 2016-06-12 DIAGNOSIS — R2689 Other abnormalities of gait and mobility: Secondary | ICD-10-CM | POA: Diagnosis not present

## 2016-06-12 DIAGNOSIS — E119 Type 2 diabetes mellitus without complications: Secondary | ICD-10-CM | POA: Diagnosis not present

## 2016-06-12 DIAGNOSIS — K922 Gastrointestinal hemorrhage, unspecified: Secondary | ICD-10-CM | POA: Diagnosis not present

## 2016-06-12 DIAGNOSIS — J441 Chronic obstructive pulmonary disease with (acute) exacerbation: Secondary | ICD-10-CM | POA: Diagnosis not present

## 2016-06-12 DIAGNOSIS — M109 Gout, unspecified: Secondary | ICD-10-CM | POA: Diagnosis not present

## 2016-06-12 DIAGNOSIS — I10 Essential (primary) hypertension: Secondary | ICD-10-CM | POA: Diagnosis not present

## 2016-06-12 DIAGNOSIS — Z87891 Personal history of nicotine dependence: Secondary | ICD-10-CM | POA: Diagnosis not present

## 2016-06-12 DIAGNOSIS — Z7984 Long term (current) use of oral hypoglycemic drugs: Secondary | ICD-10-CM | POA: Diagnosis not present

## 2016-06-16 DIAGNOSIS — Z87891 Personal history of nicotine dependence: Secondary | ICD-10-CM | POA: Diagnosis not present

## 2016-06-16 DIAGNOSIS — C3412 Malignant neoplasm of upper lobe, left bronchus or lung: Secondary | ICD-10-CM | POA: Diagnosis not present

## 2016-06-16 DIAGNOSIS — K922 Gastrointestinal hemorrhage, unspecified: Secondary | ICD-10-CM | POA: Diagnosis not present

## 2016-06-16 DIAGNOSIS — Z7984 Long term (current) use of oral hypoglycemic drugs: Secondary | ICD-10-CM | POA: Diagnosis not present

## 2016-06-16 DIAGNOSIS — E119 Type 2 diabetes mellitus without complications: Secondary | ICD-10-CM | POA: Diagnosis not present

## 2016-06-16 DIAGNOSIS — I10 Essential (primary) hypertension: Secondary | ICD-10-CM | POA: Diagnosis not present

## 2016-06-16 DIAGNOSIS — J441 Chronic obstructive pulmonary disease with (acute) exacerbation: Secondary | ICD-10-CM | POA: Diagnosis not present

## 2016-06-16 DIAGNOSIS — M109 Gout, unspecified: Secondary | ICD-10-CM | POA: Diagnosis not present

## 2016-06-16 DIAGNOSIS — R2689 Other abnormalities of gait and mobility: Secondary | ICD-10-CM | POA: Diagnosis not present

## 2016-06-17 DIAGNOSIS — R64 Cachexia: Secondary | ICD-10-CM | POA: Diagnosis not present

## 2016-06-19 DIAGNOSIS — Z87891 Personal history of nicotine dependence: Secondary | ICD-10-CM | POA: Diagnosis not present

## 2016-06-19 DIAGNOSIS — K922 Gastrointestinal hemorrhage, unspecified: Secondary | ICD-10-CM | POA: Diagnosis not present

## 2016-06-19 DIAGNOSIS — J441 Chronic obstructive pulmonary disease with (acute) exacerbation: Secondary | ICD-10-CM | POA: Diagnosis not present

## 2016-06-19 DIAGNOSIS — Z7984 Long term (current) use of oral hypoglycemic drugs: Secondary | ICD-10-CM | POA: Diagnosis not present

## 2016-06-19 DIAGNOSIS — R2689 Other abnormalities of gait and mobility: Secondary | ICD-10-CM | POA: Diagnosis not present

## 2016-06-19 DIAGNOSIS — M109 Gout, unspecified: Secondary | ICD-10-CM | POA: Diagnosis not present

## 2016-06-19 DIAGNOSIS — E119 Type 2 diabetes mellitus without complications: Secondary | ICD-10-CM | POA: Diagnosis not present

## 2016-06-19 DIAGNOSIS — I10 Essential (primary) hypertension: Secondary | ICD-10-CM | POA: Diagnosis not present

## 2016-06-19 DIAGNOSIS — C3412 Malignant neoplasm of upper lobe, left bronchus or lung: Secondary | ICD-10-CM | POA: Diagnosis not present

## 2016-06-23 DIAGNOSIS — K922 Gastrointestinal hemorrhage, unspecified: Secondary | ICD-10-CM | POA: Diagnosis not present

## 2016-06-23 DIAGNOSIS — M109 Gout, unspecified: Secondary | ICD-10-CM | POA: Diagnosis not present

## 2016-06-23 DIAGNOSIS — Z87891 Personal history of nicotine dependence: Secondary | ICD-10-CM | POA: Diagnosis not present

## 2016-06-23 DIAGNOSIS — Z7984 Long term (current) use of oral hypoglycemic drugs: Secondary | ICD-10-CM | POA: Diagnosis not present

## 2016-06-23 DIAGNOSIS — I10 Essential (primary) hypertension: Secondary | ICD-10-CM | POA: Diagnosis not present

## 2016-06-23 DIAGNOSIS — R64 Cachexia: Secondary | ICD-10-CM | POA: Diagnosis not present

## 2016-06-23 DIAGNOSIS — E119 Type 2 diabetes mellitus without complications: Secondary | ICD-10-CM | POA: Diagnosis not present

## 2016-06-23 DIAGNOSIS — R2689 Other abnormalities of gait and mobility: Secondary | ICD-10-CM | POA: Diagnosis not present

## 2016-06-23 DIAGNOSIS — C3412 Malignant neoplasm of upper lobe, left bronchus or lung: Secondary | ICD-10-CM | POA: Diagnosis not present

## 2016-06-23 DIAGNOSIS — J441 Chronic obstructive pulmonary disease with (acute) exacerbation: Secondary | ICD-10-CM | POA: Diagnosis not present

## 2016-06-26 DIAGNOSIS — M109 Gout, unspecified: Secondary | ICD-10-CM | POA: Diagnosis not present

## 2016-06-26 DIAGNOSIS — C3412 Malignant neoplasm of upper lobe, left bronchus or lung: Secondary | ICD-10-CM | POA: Diagnosis not present

## 2016-06-26 DIAGNOSIS — R2689 Other abnormalities of gait and mobility: Secondary | ICD-10-CM | POA: Diagnosis not present

## 2016-06-26 DIAGNOSIS — J441 Chronic obstructive pulmonary disease with (acute) exacerbation: Secondary | ICD-10-CM | POA: Diagnosis not present

## 2016-06-26 DIAGNOSIS — Z7984 Long term (current) use of oral hypoglycemic drugs: Secondary | ICD-10-CM | POA: Diagnosis not present

## 2016-06-26 DIAGNOSIS — I10 Essential (primary) hypertension: Secondary | ICD-10-CM | POA: Diagnosis not present

## 2016-06-26 DIAGNOSIS — E119 Type 2 diabetes mellitus without complications: Secondary | ICD-10-CM | POA: Diagnosis not present

## 2016-06-26 DIAGNOSIS — K922 Gastrointestinal hemorrhage, unspecified: Secondary | ICD-10-CM | POA: Diagnosis not present

## 2016-06-26 DIAGNOSIS — Z87891 Personal history of nicotine dependence: Secondary | ICD-10-CM | POA: Diagnosis not present

## 2016-06-30 DIAGNOSIS — J441 Chronic obstructive pulmonary disease with (acute) exacerbation: Secondary | ICD-10-CM | POA: Diagnosis not present

## 2016-06-30 DIAGNOSIS — R2689 Other abnormalities of gait and mobility: Secondary | ICD-10-CM | POA: Diagnosis not present

## 2016-06-30 DIAGNOSIS — Z87891 Personal history of nicotine dependence: Secondary | ICD-10-CM | POA: Diagnosis not present

## 2016-06-30 DIAGNOSIS — C3412 Malignant neoplasm of upper lobe, left bronchus or lung: Secondary | ICD-10-CM | POA: Diagnosis not present

## 2016-06-30 DIAGNOSIS — I10 Essential (primary) hypertension: Secondary | ICD-10-CM | POA: Diagnosis not present

## 2016-06-30 DIAGNOSIS — Z7984 Long term (current) use of oral hypoglycemic drugs: Secondary | ICD-10-CM | POA: Diagnosis not present

## 2016-06-30 DIAGNOSIS — E119 Type 2 diabetes mellitus without complications: Secondary | ICD-10-CM | POA: Diagnosis not present

## 2016-06-30 DIAGNOSIS — K922 Gastrointestinal hemorrhage, unspecified: Secondary | ICD-10-CM | POA: Diagnosis not present

## 2016-06-30 DIAGNOSIS — M109 Gout, unspecified: Secondary | ICD-10-CM | POA: Diagnosis not present

## 2016-07-02 DIAGNOSIS — R2689 Other abnormalities of gait and mobility: Secondary | ICD-10-CM | POA: Diagnosis not present

## 2016-07-02 DIAGNOSIS — C3412 Malignant neoplasm of upper lobe, left bronchus or lung: Secondary | ICD-10-CM | POA: Diagnosis not present

## 2016-07-02 DIAGNOSIS — K922 Gastrointestinal hemorrhage, unspecified: Secondary | ICD-10-CM | POA: Diagnosis not present

## 2016-07-02 DIAGNOSIS — J441 Chronic obstructive pulmonary disease with (acute) exacerbation: Secondary | ICD-10-CM | POA: Diagnosis not present

## 2016-07-02 DIAGNOSIS — Z87891 Personal history of nicotine dependence: Secondary | ICD-10-CM | POA: Diagnosis not present

## 2016-07-02 DIAGNOSIS — E119 Type 2 diabetes mellitus without complications: Secondary | ICD-10-CM | POA: Diagnosis not present

## 2016-07-02 DIAGNOSIS — Z7984 Long term (current) use of oral hypoglycemic drugs: Secondary | ICD-10-CM | POA: Diagnosis not present

## 2016-07-02 DIAGNOSIS — I10 Essential (primary) hypertension: Secondary | ICD-10-CM | POA: Diagnosis not present

## 2016-07-02 DIAGNOSIS — M109 Gout, unspecified: Secondary | ICD-10-CM | POA: Diagnosis not present

## 2016-07-05 DIAGNOSIS — R2689 Other abnormalities of gait and mobility: Secondary | ICD-10-CM | POA: Diagnosis not present

## 2016-07-05 DIAGNOSIS — C3412 Malignant neoplasm of upper lobe, left bronchus or lung: Secondary | ICD-10-CM | POA: Diagnosis not present

## 2016-07-05 DIAGNOSIS — Z87891 Personal history of nicotine dependence: Secondary | ICD-10-CM | POA: Diagnosis not present

## 2016-07-05 DIAGNOSIS — Z7984 Long term (current) use of oral hypoglycemic drugs: Secondary | ICD-10-CM | POA: Diagnosis not present

## 2016-07-05 DIAGNOSIS — K922 Gastrointestinal hemorrhage, unspecified: Secondary | ICD-10-CM | POA: Diagnosis not present

## 2016-07-05 DIAGNOSIS — I10 Essential (primary) hypertension: Secondary | ICD-10-CM | POA: Diagnosis not present

## 2016-07-05 DIAGNOSIS — E119 Type 2 diabetes mellitus without complications: Secondary | ICD-10-CM | POA: Diagnosis not present

## 2016-07-05 DIAGNOSIS — J441 Chronic obstructive pulmonary disease with (acute) exacerbation: Secondary | ICD-10-CM | POA: Diagnosis not present

## 2016-07-05 DIAGNOSIS — M109 Gout, unspecified: Secondary | ICD-10-CM | POA: Diagnosis not present

## 2016-07-07 DIAGNOSIS — Z7984 Long term (current) use of oral hypoglycemic drugs: Secondary | ICD-10-CM | POA: Diagnosis not present

## 2016-07-07 DIAGNOSIS — J441 Chronic obstructive pulmonary disease with (acute) exacerbation: Secondary | ICD-10-CM | POA: Diagnosis not present

## 2016-07-07 DIAGNOSIS — M109 Gout, unspecified: Secondary | ICD-10-CM | POA: Diagnosis not present

## 2016-07-07 DIAGNOSIS — I10 Essential (primary) hypertension: Secondary | ICD-10-CM | POA: Diagnosis not present

## 2016-07-07 DIAGNOSIS — C3412 Malignant neoplasm of upper lobe, left bronchus or lung: Secondary | ICD-10-CM | POA: Diagnosis not present

## 2016-07-07 DIAGNOSIS — K922 Gastrointestinal hemorrhage, unspecified: Secondary | ICD-10-CM | POA: Diagnosis not present

## 2016-07-07 DIAGNOSIS — R2689 Other abnormalities of gait and mobility: Secondary | ICD-10-CM | POA: Diagnosis not present

## 2016-07-07 DIAGNOSIS — E119 Type 2 diabetes mellitus without complications: Secondary | ICD-10-CM | POA: Diagnosis not present

## 2016-07-07 DIAGNOSIS — Z87891 Personal history of nicotine dependence: Secondary | ICD-10-CM | POA: Diagnosis not present

## 2016-07-08 DIAGNOSIS — R918 Other nonspecific abnormal finding of lung field: Secondary | ICD-10-CM | POA: Diagnosis not present

## 2016-07-10 DIAGNOSIS — Z959 Presence of cardiac and vascular implant and graft, unspecified: Secondary | ICD-10-CM | POA: Diagnosis not present

## 2016-07-10 DIAGNOSIS — R05 Cough: Secondary | ICD-10-CM | POA: Diagnosis not present

## 2016-07-10 DIAGNOSIS — Z7984 Long term (current) use of oral hypoglycemic drugs: Secondary | ICD-10-CM | POA: Diagnosis not present

## 2016-07-10 DIAGNOSIS — R2689 Other abnormalities of gait and mobility: Secondary | ICD-10-CM | POA: Diagnosis not present

## 2016-07-10 DIAGNOSIS — I10 Essential (primary) hypertension: Secondary | ICD-10-CM | POA: Diagnosis not present

## 2016-07-10 DIAGNOSIS — E119 Type 2 diabetes mellitus without complications: Secondary | ICD-10-CM | POA: Diagnosis not present

## 2016-07-10 DIAGNOSIS — Z95828 Presence of other vascular implants and grafts: Secondary | ICD-10-CM | POA: Diagnosis not present

## 2016-07-10 DIAGNOSIS — Z931 Gastrostomy status: Secondary | ICD-10-CM | POA: Diagnosis not present

## 2016-07-10 DIAGNOSIS — Z87891 Personal history of nicotine dependence: Secondary | ICD-10-CM | POA: Diagnosis not present

## 2016-07-10 DIAGNOSIS — C3412 Malignant neoplasm of upper lobe, left bronchus or lung: Secondary | ICD-10-CM | POA: Diagnosis not present

## 2016-07-10 DIAGNOSIS — J441 Chronic obstructive pulmonary disease with (acute) exacerbation: Secondary | ICD-10-CM | POA: Diagnosis not present

## 2016-07-10 DIAGNOSIS — M109 Gout, unspecified: Secondary | ICD-10-CM | POA: Diagnosis not present

## 2016-07-10 DIAGNOSIS — K922 Gastrointestinal hemorrhage, unspecified: Secondary | ICD-10-CM | POA: Diagnosis not present

## 2016-07-14 DIAGNOSIS — C3412 Malignant neoplasm of upper lobe, left bronchus or lung: Secondary | ICD-10-CM | POA: Diagnosis not present

## 2016-07-14 DIAGNOSIS — M109 Gout, unspecified: Secondary | ICD-10-CM | POA: Diagnosis not present

## 2016-07-14 DIAGNOSIS — Z87891 Personal history of nicotine dependence: Secondary | ICD-10-CM | POA: Diagnosis not present

## 2016-07-14 DIAGNOSIS — K922 Gastrointestinal hemorrhage, unspecified: Secondary | ICD-10-CM | POA: Diagnosis not present

## 2016-07-14 DIAGNOSIS — E119 Type 2 diabetes mellitus without complications: Secondary | ICD-10-CM | POA: Diagnosis not present

## 2016-07-14 DIAGNOSIS — R2689 Other abnormalities of gait and mobility: Secondary | ICD-10-CM | POA: Diagnosis not present

## 2016-07-14 DIAGNOSIS — Z7984 Long term (current) use of oral hypoglycemic drugs: Secondary | ICD-10-CM | POA: Diagnosis not present

## 2016-07-14 DIAGNOSIS — J441 Chronic obstructive pulmonary disease with (acute) exacerbation: Secondary | ICD-10-CM | POA: Diagnosis not present

## 2016-07-14 DIAGNOSIS — I10 Essential (primary) hypertension: Secondary | ICD-10-CM | POA: Diagnosis not present

## 2016-07-17 DIAGNOSIS — R64 Cachexia: Secondary | ICD-10-CM | POA: Diagnosis not present

## 2016-07-17 DIAGNOSIS — C3412 Malignant neoplasm of upper lobe, left bronchus or lung: Secondary | ICD-10-CM | POA: Diagnosis not present

## 2016-07-17 DIAGNOSIS — E119 Type 2 diabetes mellitus without complications: Secondary | ICD-10-CM | POA: Diagnosis not present

## 2016-07-17 DIAGNOSIS — R2689 Other abnormalities of gait and mobility: Secondary | ICD-10-CM | POA: Diagnosis not present

## 2016-07-17 DIAGNOSIS — Z7984 Long term (current) use of oral hypoglycemic drugs: Secondary | ICD-10-CM | POA: Diagnosis not present

## 2016-07-17 DIAGNOSIS — Z87891 Personal history of nicotine dependence: Secondary | ICD-10-CM | POA: Diagnosis not present

## 2016-07-17 DIAGNOSIS — J441 Chronic obstructive pulmonary disease with (acute) exacerbation: Secondary | ICD-10-CM | POA: Diagnosis not present

## 2016-07-17 DIAGNOSIS — K922 Gastrointestinal hemorrhage, unspecified: Secondary | ICD-10-CM | POA: Diagnosis not present

## 2016-07-17 DIAGNOSIS — M109 Gout, unspecified: Secondary | ICD-10-CM | POA: Diagnosis not present

## 2016-07-17 DIAGNOSIS — I10 Essential (primary) hypertension: Secondary | ICD-10-CM | POA: Diagnosis not present

## 2016-07-22 DIAGNOSIS — J449 Chronic obstructive pulmonary disease, unspecified: Secondary | ICD-10-CM | POA: Diagnosis not present

## 2016-07-22 DIAGNOSIS — R2689 Other abnormalities of gait and mobility: Secondary | ICD-10-CM | POA: Diagnosis not present

## 2016-07-22 DIAGNOSIS — I10 Essential (primary) hypertension: Secondary | ICD-10-CM | POA: Diagnosis not present

## 2016-07-22 DIAGNOSIS — Z7984 Long term (current) use of oral hypoglycemic drugs: Secondary | ICD-10-CM | POA: Diagnosis not present

## 2016-07-22 DIAGNOSIS — E119 Type 2 diabetes mellitus without complications: Secondary | ICD-10-CM | POA: Diagnosis not present

## 2016-07-22 DIAGNOSIS — Z87891 Personal history of nicotine dependence: Secondary | ICD-10-CM | POA: Diagnosis not present

## 2016-07-22 DIAGNOSIS — C3412 Malignant neoplasm of upper lobe, left bronchus or lung: Secondary | ICD-10-CM | POA: Diagnosis not present

## 2016-07-22 DIAGNOSIS — M109 Gout, unspecified: Secondary | ICD-10-CM | POA: Diagnosis not present

## 2016-07-22 DIAGNOSIS — Z931 Gastrostomy status: Secondary | ICD-10-CM | POA: Diagnosis not present

## 2016-07-24 DIAGNOSIS — Z931 Gastrostomy status: Secondary | ICD-10-CM | POA: Diagnosis not present

## 2016-07-24 DIAGNOSIS — E119 Type 2 diabetes mellitus without complications: Secondary | ICD-10-CM | POA: Diagnosis not present

## 2016-07-24 DIAGNOSIS — I10 Essential (primary) hypertension: Secondary | ICD-10-CM | POA: Diagnosis not present

## 2016-07-24 DIAGNOSIS — C3412 Malignant neoplasm of upper lobe, left bronchus or lung: Secondary | ICD-10-CM | POA: Diagnosis not present

## 2016-07-24 DIAGNOSIS — M109 Gout, unspecified: Secondary | ICD-10-CM | POA: Diagnosis not present

## 2016-07-24 DIAGNOSIS — R2689 Other abnormalities of gait and mobility: Secondary | ICD-10-CM | POA: Diagnosis not present

## 2016-07-24 DIAGNOSIS — J449 Chronic obstructive pulmonary disease, unspecified: Secondary | ICD-10-CM | POA: Diagnosis not present

## 2016-07-24 DIAGNOSIS — Z87891 Personal history of nicotine dependence: Secondary | ICD-10-CM | POA: Diagnosis not present

## 2016-07-24 DIAGNOSIS — Z7984 Long term (current) use of oral hypoglycemic drugs: Secondary | ICD-10-CM | POA: Diagnosis not present

## 2016-07-29 DIAGNOSIS — I10 Essential (primary) hypertension: Secondary | ICD-10-CM | POA: Diagnosis not present

## 2016-07-29 DIAGNOSIS — Z87891 Personal history of nicotine dependence: Secondary | ICD-10-CM | POA: Diagnosis not present

## 2016-07-29 DIAGNOSIS — C3412 Malignant neoplasm of upper lobe, left bronchus or lung: Secondary | ICD-10-CM | POA: Diagnosis not present

## 2016-07-29 DIAGNOSIS — E119 Type 2 diabetes mellitus without complications: Secondary | ICD-10-CM | POA: Diagnosis not present

## 2016-07-29 DIAGNOSIS — R2689 Other abnormalities of gait and mobility: Secondary | ICD-10-CM | POA: Diagnosis not present

## 2016-07-29 DIAGNOSIS — Z7984 Long term (current) use of oral hypoglycemic drugs: Secondary | ICD-10-CM | POA: Diagnosis not present

## 2016-07-29 DIAGNOSIS — J449 Chronic obstructive pulmonary disease, unspecified: Secondary | ICD-10-CM | POA: Diagnosis not present

## 2016-07-29 DIAGNOSIS — M109 Gout, unspecified: Secondary | ICD-10-CM | POA: Diagnosis not present

## 2016-07-29 DIAGNOSIS — Z931 Gastrostomy status: Secondary | ICD-10-CM | POA: Diagnosis not present

## 2016-07-31 DIAGNOSIS — Z87891 Personal history of nicotine dependence: Secondary | ICD-10-CM | POA: Diagnosis not present

## 2016-07-31 DIAGNOSIS — Z931 Gastrostomy status: Secondary | ICD-10-CM | POA: Diagnosis not present

## 2016-07-31 DIAGNOSIS — I10 Essential (primary) hypertension: Secondary | ICD-10-CM | POA: Diagnosis not present

## 2016-07-31 DIAGNOSIS — E119 Type 2 diabetes mellitus without complications: Secondary | ICD-10-CM | POA: Diagnosis not present

## 2016-07-31 DIAGNOSIS — C3412 Malignant neoplasm of upper lobe, left bronchus or lung: Secondary | ICD-10-CM | POA: Diagnosis not present

## 2016-07-31 DIAGNOSIS — R2689 Other abnormalities of gait and mobility: Secondary | ICD-10-CM | POA: Diagnosis not present

## 2016-07-31 DIAGNOSIS — M109 Gout, unspecified: Secondary | ICD-10-CM | POA: Diagnosis not present

## 2016-07-31 DIAGNOSIS — J449 Chronic obstructive pulmonary disease, unspecified: Secondary | ICD-10-CM | POA: Diagnosis not present

## 2016-07-31 DIAGNOSIS — Z7984 Long term (current) use of oral hypoglycemic drugs: Secondary | ICD-10-CM | POA: Diagnosis not present

## 2016-08-05 DIAGNOSIS — I10 Essential (primary) hypertension: Secondary | ICD-10-CM | POA: Diagnosis not present

## 2016-08-05 DIAGNOSIS — Z7984 Long term (current) use of oral hypoglycemic drugs: Secondary | ICD-10-CM | POA: Diagnosis not present

## 2016-08-05 DIAGNOSIS — R2689 Other abnormalities of gait and mobility: Secondary | ICD-10-CM | POA: Diagnosis not present

## 2016-08-05 DIAGNOSIS — C3412 Malignant neoplasm of upper lobe, left bronchus or lung: Secondary | ICD-10-CM | POA: Diagnosis not present

## 2016-08-05 DIAGNOSIS — E119 Type 2 diabetes mellitus without complications: Secondary | ICD-10-CM | POA: Diagnosis not present

## 2016-08-05 DIAGNOSIS — Z931 Gastrostomy status: Secondary | ICD-10-CM | POA: Diagnosis not present

## 2016-08-05 DIAGNOSIS — Z87891 Personal history of nicotine dependence: Secondary | ICD-10-CM | POA: Diagnosis not present

## 2016-08-05 DIAGNOSIS — J449 Chronic obstructive pulmonary disease, unspecified: Secondary | ICD-10-CM | POA: Diagnosis not present

## 2016-08-05 DIAGNOSIS — M109 Gout, unspecified: Secondary | ICD-10-CM | POA: Diagnosis not present

## 2016-08-07 DIAGNOSIS — E119 Type 2 diabetes mellitus without complications: Secondary | ICD-10-CM | POA: Diagnosis not present

## 2016-08-07 DIAGNOSIS — Z7984 Long term (current) use of oral hypoglycemic drugs: Secondary | ICD-10-CM | POA: Diagnosis not present

## 2016-08-07 DIAGNOSIS — J449 Chronic obstructive pulmonary disease, unspecified: Secondary | ICD-10-CM | POA: Diagnosis not present

## 2016-08-07 DIAGNOSIS — Z931 Gastrostomy status: Secondary | ICD-10-CM | POA: Diagnosis not present

## 2016-08-07 DIAGNOSIS — M109 Gout, unspecified: Secondary | ICD-10-CM | POA: Diagnosis not present

## 2016-08-07 DIAGNOSIS — Z87891 Personal history of nicotine dependence: Secondary | ICD-10-CM | POA: Diagnosis not present

## 2016-08-07 DIAGNOSIS — I10 Essential (primary) hypertension: Secondary | ICD-10-CM | POA: Diagnosis not present

## 2016-08-07 DIAGNOSIS — R2689 Other abnormalities of gait and mobility: Secondary | ICD-10-CM | POA: Diagnosis not present

## 2016-08-07 DIAGNOSIS — C3412 Malignant neoplasm of upper lobe, left bronchus or lung: Secondary | ICD-10-CM | POA: Diagnosis not present

## 2016-08-07 DIAGNOSIS — R918 Other nonspecific abnormal finding of lung field: Secondary | ICD-10-CM | POA: Diagnosis not present

## 2016-08-13 DIAGNOSIS — Z9181 History of falling: Secondary | ICD-10-CM | POA: Diagnosis not present

## 2016-08-13 DIAGNOSIS — Z931 Gastrostomy status: Secondary | ICD-10-CM | POA: Diagnosis not present

## 2016-08-13 DIAGNOSIS — E119 Type 2 diabetes mellitus without complications: Secondary | ICD-10-CM | POA: Diagnosis not present

## 2016-08-13 DIAGNOSIS — I10 Essential (primary) hypertension: Secondary | ICD-10-CM | POA: Diagnosis not present

## 2016-08-13 DIAGNOSIS — J449 Chronic obstructive pulmonary disease, unspecified: Secondary | ICD-10-CM | POA: Diagnosis not present

## 2016-08-13 DIAGNOSIS — Z87891 Personal history of nicotine dependence: Secondary | ICD-10-CM | POA: Diagnosis not present

## 2016-08-13 DIAGNOSIS — C3412 Malignant neoplasm of upper lobe, left bronchus or lung: Secondary | ICD-10-CM | POA: Diagnosis not present

## 2016-08-13 DIAGNOSIS — M109 Gout, unspecified: Secondary | ICD-10-CM | POA: Diagnosis not present

## 2016-08-13 DIAGNOSIS — Z7984 Long term (current) use of oral hypoglycemic drugs: Secondary | ICD-10-CM | POA: Diagnosis not present

## 2016-08-17 DIAGNOSIS — R64 Cachexia: Secondary | ICD-10-CM | POA: Diagnosis not present

## 2016-08-19 DIAGNOSIS — E119 Type 2 diabetes mellitus without complications: Secondary | ICD-10-CM | POA: Diagnosis not present

## 2016-08-19 DIAGNOSIS — I10 Essential (primary) hypertension: Secondary | ICD-10-CM | POA: Diagnosis not present

## 2016-08-19 DIAGNOSIS — Z9181 History of falling: Secondary | ICD-10-CM | POA: Diagnosis not present

## 2016-08-19 DIAGNOSIS — M109 Gout, unspecified: Secondary | ICD-10-CM | POA: Diagnosis not present

## 2016-08-19 DIAGNOSIS — Z931 Gastrostomy status: Secondary | ICD-10-CM | POA: Diagnosis not present

## 2016-08-19 DIAGNOSIS — J449 Chronic obstructive pulmonary disease, unspecified: Secondary | ICD-10-CM | POA: Diagnosis not present

## 2016-08-19 DIAGNOSIS — Z7984 Long term (current) use of oral hypoglycemic drugs: Secondary | ICD-10-CM | POA: Diagnosis not present

## 2016-08-19 DIAGNOSIS — Z87891 Personal history of nicotine dependence: Secondary | ICD-10-CM | POA: Diagnosis not present

## 2016-08-19 DIAGNOSIS — C3412 Malignant neoplasm of upper lobe, left bronchus or lung: Secondary | ICD-10-CM | POA: Diagnosis not present

## 2016-08-21 DIAGNOSIS — R05 Cough: Secondary | ICD-10-CM | POA: Diagnosis not present

## 2016-08-21 DIAGNOSIS — Z923 Personal history of irradiation: Secondary | ICD-10-CM | POA: Diagnosis not present

## 2016-08-21 DIAGNOSIS — Z9221 Personal history of antineoplastic chemotherapy: Secondary | ICD-10-CM | POA: Diagnosis not present

## 2016-08-21 DIAGNOSIS — Z931 Gastrostomy status: Secondary | ICD-10-CM | POA: Diagnosis not present

## 2016-08-21 DIAGNOSIS — C3412 Malignant neoplasm of upper lobe, left bronchus or lung: Secondary | ICD-10-CM | POA: Diagnosis not present

## 2016-08-21 DIAGNOSIS — Z959 Presence of cardiac and vascular implant and graft, unspecified: Secondary | ICD-10-CM | POA: Diagnosis not present

## 2016-08-21 DIAGNOSIS — R531 Weakness: Secondary | ICD-10-CM | POA: Diagnosis not present

## 2016-08-21 DIAGNOSIS — Z9981 Dependence on supplemental oxygen: Secondary | ICD-10-CM | POA: Diagnosis not present

## 2016-08-21 DIAGNOSIS — K219 Gastro-esophageal reflux disease without esophagitis: Secondary | ICD-10-CM | POA: Diagnosis not present

## 2016-08-21 DIAGNOSIS — C7970 Secondary malignant neoplasm of unspecified adrenal gland: Secondary | ICD-10-CM | POA: Diagnosis not present

## 2016-08-21 DIAGNOSIS — Z95828 Presence of other vascular implants and grafts: Secondary | ICD-10-CM | POA: Diagnosis not present

## 2016-08-22 DIAGNOSIS — Z931 Gastrostomy status: Secondary | ICD-10-CM | POA: Diagnosis not present

## 2016-08-22 DIAGNOSIS — E119 Type 2 diabetes mellitus without complications: Secondary | ICD-10-CM | POA: Diagnosis not present

## 2016-08-22 DIAGNOSIS — Z9181 History of falling: Secondary | ICD-10-CM | POA: Diagnosis not present

## 2016-08-22 DIAGNOSIS — C3412 Malignant neoplasm of upper lobe, left bronchus or lung: Secondary | ICD-10-CM | POA: Diagnosis not present

## 2016-08-22 DIAGNOSIS — I10 Essential (primary) hypertension: Secondary | ICD-10-CM | POA: Diagnosis not present

## 2016-08-22 DIAGNOSIS — Z87891 Personal history of nicotine dependence: Secondary | ICD-10-CM | POA: Diagnosis not present

## 2016-08-22 DIAGNOSIS — J449 Chronic obstructive pulmonary disease, unspecified: Secondary | ICD-10-CM | POA: Diagnosis not present

## 2016-08-22 DIAGNOSIS — M109 Gout, unspecified: Secondary | ICD-10-CM | POA: Diagnosis not present

## 2016-08-22 DIAGNOSIS — Z7984 Long term (current) use of oral hypoglycemic drugs: Secondary | ICD-10-CM | POA: Diagnosis not present

## 2016-08-28 DIAGNOSIS — M109 Gout, unspecified: Secondary | ICD-10-CM | POA: Diagnosis not present

## 2016-08-28 DIAGNOSIS — J449 Chronic obstructive pulmonary disease, unspecified: Secondary | ICD-10-CM | POA: Diagnosis not present

## 2016-08-28 DIAGNOSIS — E119 Type 2 diabetes mellitus without complications: Secondary | ICD-10-CM | POA: Diagnosis not present

## 2016-08-28 DIAGNOSIS — I10 Essential (primary) hypertension: Secondary | ICD-10-CM | POA: Diagnosis not present

## 2016-08-28 DIAGNOSIS — Z7984 Long term (current) use of oral hypoglycemic drugs: Secondary | ICD-10-CM | POA: Diagnosis not present

## 2016-08-28 DIAGNOSIS — Z9181 History of falling: Secondary | ICD-10-CM | POA: Diagnosis not present

## 2016-08-28 DIAGNOSIS — Z87891 Personal history of nicotine dependence: Secondary | ICD-10-CM | POA: Diagnosis not present

## 2016-08-28 DIAGNOSIS — Z931 Gastrostomy status: Secondary | ICD-10-CM | POA: Diagnosis not present

## 2016-08-28 DIAGNOSIS — C3412 Malignant neoplasm of upper lobe, left bronchus or lung: Secondary | ICD-10-CM | POA: Diagnosis not present

## 2016-08-29 DIAGNOSIS — C3412 Malignant neoplasm of upper lobe, left bronchus or lung: Secondary | ICD-10-CM | POA: Diagnosis not present

## 2016-08-29 DIAGNOSIS — Z87891 Personal history of nicotine dependence: Secondary | ICD-10-CM | POA: Diagnosis not present

## 2016-08-29 DIAGNOSIS — Z7984 Long term (current) use of oral hypoglycemic drugs: Secondary | ICD-10-CM | POA: Diagnosis not present

## 2016-08-29 DIAGNOSIS — E119 Type 2 diabetes mellitus without complications: Secondary | ICD-10-CM | POA: Diagnosis not present

## 2016-08-29 DIAGNOSIS — J449 Chronic obstructive pulmonary disease, unspecified: Secondary | ICD-10-CM | POA: Diagnosis not present

## 2016-08-29 DIAGNOSIS — M109 Gout, unspecified: Secondary | ICD-10-CM | POA: Diagnosis not present

## 2016-08-29 DIAGNOSIS — Z9181 History of falling: Secondary | ICD-10-CM | POA: Diagnosis not present

## 2016-08-29 DIAGNOSIS — I10 Essential (primary) hypertension: Secondary | ICD-10-CM | POA: Diagnosis not present

## 2016-08-29 DIAGNOSIS — Z931 Gastrostomy status: Secondary | ICD-10-CM | POA: Diagnosis not present

## 2016-09-01 DIAGNOSIS — Z931 Gastrostomy status: Secondary | ICD-10-CM | POA: Diagnosis not present

## 2016-09-01 DIAGNOSIS — C3412 Malignant neoplasm of upper lobe, left bronchus or lung: Secondary | ICD-10-CM | POA: Diagnosis not present

## 2016-09-01 DIAGNOSIS — Z7984 Long term (current) use of oral hypoglycemic drugs: Secondary | ICD-10-CM | POA: Diagnosis not present

## 2016-09-01 DIAGNOSIS — M109 Gout, unspecified: Secondary | ICD-10-CM | POA: Diagnosis not present

## 2016-09-01 DIAGNOSIS — Z9181 History of falling: Secondary | ICD-10-CM | POA: Diagnosis not present

## 2016-09-01 DIAGNOSIS — E119 Type 2 diabetes mellitus without complications: Secondary | ICD-10-CM | POA: Diagnosis not present

## 2016-09-01 DIAGNOSIS — J449 Chronic obstructive pulmonary disease, unspecified: Secondary | ICD-10-CM | POA: Diagnosis not present

## 2016-09-01 DIAGNOSIS — Z87891 Personal history of nicotine dependence: Secondary | ICD-10-CM | POA: Diagnosis not present

## 2016-09-01 DIAGNOSIS — I10 Essential (primary) hypertension: Secondary | ICD-10-CM | POA: Diagnosis not present

## 2016-09-03 DIAGNOSIS — Z87891 Personal history of nicotine dependence: Secondary | ICD-10-CM | POA: Diagnosis not present

## 2016-09-03 DIAGNOSIS — I10 Essential (primary) hypertension: Secondary | ICD-10-CM | POA: Diagnosis not present

## 2016-09-03 DIAGNOSIS — Z7984 Long term (current) use of oral hypoglycemic drugs: Secondary | ICD-10-CM | POA: Diagnosis not present

## 2016-09-03 DIAGNOSIS — C3412 Malignant neoplasm of upper lobe, left bronchus or lung: Secondary | ICD-10-CM | POA: Diagnosis not present

## 2016-09-03 DIAGNOSIS — M109 Gout, unspecified: Secondary | ICD-10-CM | POA: Diagnosis not present

## 2016-09-03 DIAGNOSIS — Z931 Gastrostomy status: Secondary | ICD-10-CM | POA: Diagnosis not present

## 2016-09-03 DIAGNOSIS — J449 Chronic obstructive pulmonary disease, unspecified: Secondary | ICD-10-CM | POA: Diagnosis not present

## 2016-09-03 DIAGNOSIS — E119 Type 2 diabetes mellitus without complications: Secondary | ICD-10-CM | POA: Diagnosis not present

## 2016-09-03 DIAGNOSIS — Z9181 History of falling: Secondary | ICD-10-CM | POA: Diagnosis not present

## 2016-09-04 DIAGNOSIS — I10 Essential (primary) hypertension: Secondary | ICD-10-CM | POA: Diagnosis not present

## 2016-09-04 DIAGNOSIS — M109 Gout, unspecified: Secondary | ICD-10-CM | POA: Diagnosis not present

## 2016-09-04 DIAGNOSIS — C3412 Malignant neoplasm of upper lobe, left bronchus or lung: Secondary | ICD-10-CM | POA: Diagnosis not present

## 2016-09-04 DIAGNOSIS — Z7984 Long term (current) use of oral hypoglycemic drugs: Secondary | ICD-10-CM | POA: Diagnosis not present

## 2016-09-04 DIAGNOSIS — E119 Type 2 diabetes mellitus without complications: Secondary | ICD-10-CM | POA: Diagnosis not present

## 2016-09-04 DIAGNOSIS — Z87891 Personal history of nicotine dependence: Secondary | ICD-10-CM | POA: Diagnosis not present

## 2016-09-04 DIAGNOSIS — Z931 Gastrostomy status: Secondary | ICD-10-CM | POA: Diagnosis not present

## 2016-09-04 DIAGNOSIS — Z9181 History of falling: Secondary | ICD-10-CM | POA: Diagnosis not present

## 2016-09-04 DIAGNOSIS — J449 Chronic obstructive pulmonary disease, unspecified: Secondary | ICD-10-CM | POA: Diagnosis not present

## 2016-09-07 DIAGNOSIS — R918 Other nonspecific abnormal finding of lung field: Secondary | ICD-10-CM | POA: Diagnosis not present

## 2016-09-08 DIAGNOSIS — J449 Chronic obstructive pulmonary disease, unspecified: Secondary | ICD-10-CM | POA: Diagnosis not present

## 2016-09-08 DIAGNOSIS — Z9181 History of falling: Secondary | ICD-10-CM | POA: Diagnosis not present

## 2016-09-08 DIAGNOSIS — M109 Gout, unspecified: Secondary | ICD-10-CM | POA: Diagnosis not present

## 2016-09-08 DIAGNOSIS — Z87891 Personal history of nicotine dependence: Secondary | ICD-10-CM | POA: Diagnosis not present

## 2016-09-08 DIAGNOSIS — E119 Type 2 diabetes mellitus without complications: Secondary | ICD-10-CM | POA: Diagnosis not present

## 2016-09-08 DIAGNOSIS — I10 Essential (primary) hypertension: Secondary | ICD-10-CM | POA: Diagnosis not present

## 2016-09-08 DIAGNOSIS — Z931 Gastrostomy status: Secondary | ICD-10-CM | POA: Diagnosis not present

## 2016-09-08 DIAGNOSIS — C3412 Malignant neoplasm of upper lobe, left bronchus or lung: Secondary | ICD-10-CM | POA: Diagnosis not present

## 2016-09-08 DIAGNOSIS — Z7984 Long term (current) use of oral hypoglycemic drugs: Secondary | ICD-10-CM | POA: Diagnosis not present

## 2016-09-11 DIAGNOSIS — C3412 Malignant neoplasm of upper lobe, left bronchus or lung: Secondary | ICD-10-CM | POA: Diagnosis not present

## 2016-09-11 DIAGNOSIS — Z931 Gastrostomy status: Secondary | ICD-10-CM | POA: Diagnosis not present

## 2016-09-11 DIAGNOSIS — Z87891 Personal history of nicotine dependence: Secondary | ICD-10-CM | POA: Diagnosis not present

## 2016-09-11 DIAGNOSIS — M109 Gout, unspecified: Secondary | ICD-10-CM | POA: Diagnosis not present

## 2016-09-11 DIAGNOSIS — E119 Type 2 diabetes mellitus without complications: Secondary | ICD-10-CM | POA: Diagnosis not present

## 2016-09-11 DIAGNOSIS — J449 Chronic obstructive pulmonary disease, unspecified: Secondary | ICD-10-CM | POA: Diagnosis not present

## 2016-09-11 DIAGNOSIS — Z7984 Long term (current) use of oral hypoglycemic drugs: Secondary | ICD-10-CM | POA: Diagnosis not present

## 2016-09-11 DIAGNOSIS — Z9181 History of falling: Secondary | ICD-10-CM | POA: Diagnosis not present

## 2016-09-11 DIAGNOSIS — I10 Essential (primary) hypertension: Secondary | ICD-10-CM | POA: Diagnosis not present

## 2016-09-15 DIAGNOSIS — J449 Chronic obstructive pulmonary disease, unspecified: Secondary | ICD-10-CM | POA: Diagnosis not present

## 2016-09-15 DIAGNOSIS — Z7984 Long term (current) use of oral hypoglycemic drugs: Secondary | ICD-10-CM | POA: Diagnosis not present

## 2016-09-15 DIAGNOSIS — Z931 Gastrostomy status: Secondary | ICD-10-CM | POA: Diagnosis not present

## 2016-09-15 DIAGNOSIS — E119 Type 2 diabetes mellitus without complications: Secondary | ICD-10-CM | POA: Diagnosis not present

## 2016-09-15 DIAGNOSIS — Z87891 Personal history of nicotine dependence: Secondary | ICD-10-CM | POA: Diagnosis not present

## 2016-09-15 DIAGNOSIS — Z9181 History of falling: Secondary | ICD-10-CM | POA: Diagnosis not present

## 2016-09-15 DIAGNOSIS — C3412 Malignant neoplasm of upper lobe, left bronchus or lung: Secondary | ICD-10-CM | POA: Diagnosis not present

## 2016-09-15 DIAGNOSIS — M109 Gout, unspecified: Secondary | ICD-10-CM | POA: Diagnosis not present

## 2016-09-15 DIAGNOSIS — I10 Essential (primary) hypertension: Secondary | ICD-10-CM | POA: Diagnosis not present

## 2016-09-17 DIAGNOSIS — J449 Chronic obstructive pulmonary disease, unspecified: Secondary | ICD-10-CM | POA: Diagnosis not present

## 2016-09-17 DIAGNOSIS — R64 Cachexia: Secondary | ICD-10-CM | POA: Diagnosis not present

## 2016-09-17 DIAGNOSIS — Z9181 History of falling: Secondary | ICD-10-CM | POA: Diagnosis not present

## 2016-09-17 DIAGNOSIS — M109 Gout, unspecified: Secondary | ICD-10-CM | POA: Diagnosis not present

## 2016-09-17 DIAGNOSIS — Z87891 Personal history of nicotine dependence: Secondary | ICD-10-CM | POA: Diagnosis not present

## 2016-09-17 DIAGNOSIS — Z7984 Long term (current) use of oral hypoglycemic drugs: Secondary | ICD-10-CM | POA: Diagnosis not present

## 2016-09-17 DIAGNOSIS — I10 Essential (primary) hypertension: Secondary | ICD-10-CM | POA: Diagnosis not present

## 2016-09-17 DIAGNOSIS — Z931 Gastrostomy status: Secondary | ICD-10-CM | POA: Diagnosis not present

## 2016-09-17 DIAGNOSIS — C3412 Malignant neoplasm of upper lobe, left bronchus or lung: Secondary | ICD-10-CM | POA: Diagnosis not present

## 2016-09-17 DIAGNOSIS — E119 Type 2 diabetes mellitus without complications: Secondary | ICD-10-CM | POA: Diagnosis not present

## 2016-09-19 DIAGNOSIS — M109 Gout, unspecified: Secondary | ICD-10-CM | POA: Diagnosis not present

## 2016-09-19 DIAGNOSIS — R5383 Other fatigue: Secondary | ICD-10-CM | POA: Diagnosis not present

## 2016-09-19 DIAGNOSIS — Z0000 Encounter for general adult medical examination without abnormal findings: Secondary | ICD-10-CM | POA: Diagnosis not present

## 2016-09-19 DIAGNOSIS — E0590 Thyrotoxicosis, unspecified without thyrotoxic crisis or storm: Secondary | ICD-10-CM | POA: Diagnosis not present

## 2016-09-19 DIAGNOSIS — D509 Iron deficiency anemia, unspecified: Secondary | ICD-10-CM | POA: Diagnosis not present

## 2016-09-19 DIAGNOSIS — E782 Mixed hyperlipidemia: Secondary | ICD-10-CM | POA: Diagnosis not present

## 2016-09-23 DIAGNOSIS — C3412 Malignant neoplasm of upper lobe, left bronchus or lung: Secondary | ICD-10-CM | POA: Diagnosis not present

## 2016-09-23 DIAGNOSIS — M109 Gout, unspecified: Secondary | ICD-10-CM | POA: Diagnosis not present

## 2016-09-23 DIAGNOSIS — Z9181 History of falling: Secondary | ICD-10-CM | POA: Diagnosis not present

## 2016-09-23 DIAGNOSIS — J449 Chronic obstructive pulmonary disease, unspecified: Secondary | ICD-10-CM | POA: Diagnosis not present

## 2016-09-23 DIAGNOSIS — Z87891 Personal history of nicotine dependence: Secondary | ICD-10-CM | POA: Diagnosis not present

## 2016-09-23 DIAGNOSIS — Z931 Gastrostomy status: Secondary | ICD-10-CM | POA: Diagnosis not present

## 2016-09-23 DIAGNOSIS — E119 Type 2 diabetes mellitus without complications: Secondary | ICD-10-CM | POA: Diagnosis not present

## 2016-09-23 DIAGNOSIS — I10 Essential (primary) hypertension: Secondary | ICD-10-CM | POA: Diagnosis not present

## 2016-09-23 DIAGNOSIS — Z7984 Long term (current) use of oral hypoglycemic drugs: Secondary | ICD-10-CM | POA: Diagnosis not present

## 2016-09-26 DIAGNOSIS — Z9181 History of falling: Secondary | ICD-10-CM | POA: Diagnosis not present

## 2016-09-26 DIAGNOSIS — M109 Gout, unspecified: Secondary | ICD-10-CM | POA: Diagnosis not present

## 2016-09-26 DIAGNOSIS — C3412 Malignant neoplasm of upper lobe, left bronchus or lung: Secondary | ICD-10-CM | POA: Diagnosis not present

## 2016-09-26 DIAGNOSIS — Z7984 Long term (current) use of oral hypoglycemic drugs: Secondary | ICD-10-CM | POA: Diagnosis not present

## 2016-09-26 DIAGNOSIS — E119 Type 2 diabetes mellitus without complications: Secondary | ICD-10-CM | POA: Diagnosis not present

## 2016-09-26 DIAGNOSIS — Z87891 Personal history of nicotine dependence: Secondary | ICD-10-CM | POA: Diagnosis not present

## 2016-09-26 DIAGNOSIS — Z931 Gastrostomy status: Secondary | ICD-10-CM | POA: Diagnosis not present

## 2016-09-26 DIAGNOSIS — J449 Chronic obstructive pulmonary disease, unspecified: Secondary | ICD-10-CM | POA: Diagnosis not present

## 2016-09-26 DIAGNOSIS — I10 Essential (primary) hypertension: Secondary | ICD-10-CM | POA: Diagnosis not present

## 2016-10-01 DIAGNOSIS — Z87891 Personal history of nicotine dependence: Secondary | ICD-10-CM | POA: Diagnosis not present

## 2016-10-01 DIAGNOSIS — Z931 Gastrostomy status: Secondary | ICD-10-CM | POA: Diagnosis not present

## 2016-10-01 DIAGNOSIS — C3412 Malignant neoplasm of upper lobe, left bronchus or lung: Secondary | ICD-10-CM | POA: Diagnosis not present

## 2016-10-01 DIAGNOSIS — I10 Essential (primary) hypertension: Secondary | ICD-10-CM | POA: Diagnosis not present

## 2016-10-01 DIAGNOSIS — J449 Chronic obstructive pulmonary disease, unspecified: Secondary | ICD-10-CM | POA: Diagnosis not present

## 2016-10-01 DIAGNOSIS — E119 Type 2 diabetes mellitus without complications: Secondary | ICD-10-CM | POA: Diagnosis not present

## 2016-10-01 DIAGNOSIS — Z9181 History of falling: Secondary | ICD-10-CM | POA: Diagnosis not present

## 2016-10-01 DIAGNOSIS — Z7984 Long term (current) use of oral hypoglycemic drugs: Secondary | ICD-10-CM | POA: Diagnosis not present

## 2016-10-01 DIAGNOSIS — M109 Gout, unspecified: Secondary | ICD-10-CM | POA: Diagnosis not present

## 2016-10-03 DIAGNOSIS — Z9181 History of falling: Secondary | ICD-10-CM | POA: Diagnosis not present

## 2016-10-03 DIAGNOSIS — M109 Gout, unspecified: Secondary | ICD-10-CM | POA: Diagnosis not present

## 2016-10-03 DIAGNOSIS — E119 Type 2 diabetes mellitus without complications: Secondary | ICD-10-CM | POA: Diagnosis not present

## 2016-10-03 DIAGNOSIS — Z87891 Personal history of nicotine dependence: Secondary | ICD-10-CM | POA: Diagnosis not present

## 2016-10-03 DIAGNOSIS — Z7984 Long term (current) use of oral hypoglycemic drugs: Secondary | ICD-10-CM | POA: Diagnosis not present

## 2016-10-03 DIAGNOSIS — C3412 Malignant neoplasm of upper lobe, left bronchus or lung: Secondary | ICD-10-CM | POA: Diagnosis not present

## 2016-10-03 DIAGNOSIS — Z931 Gastrostomy status: Secondary | ICD-10-CM | POA: Diagnosis not present

## 2016-10-03 DIAGNOSIS — I10 Essential (primary) hypertension: Secondary | ICD-10-CM | POA: Diagnosis not present

## 2016-10-03 DIAGNOSIS — J449 Chronic obstructive pulmonary disease, unspecified: Secondary | ICD-10-CM | POA: Diagnosis not present

## 2016-10-08 DIAGNOSIS — Z7984 Long term (current) use of oral hypoglycemic drugs: Secondary | ICD-10-CM | POA: Diagnosis not present

## 2016-10-08 DIAGNOSIS — Z87891 Personal history of nicotine dependence: Secondary | ICD-10-CM | POA: Diagnosis not present

## 2016-10-08 DIAGNOSIS — E119 Type 2 diabetes mellitus without complications: Secondary | ICD-10-CM | POA: Diagnosis not present

## 2016-10-08 DIAGNOSIS — J449 Chronic obstructive pulmonary disease, unspecified: Secondary | ICD-10-CM | POA: Diagnosis not present

## 2016-10-08 DIAGNOSIS — R918 Other nonspecific abnormal finding of lung field: Secondary | ICD-10-CM | POA: Diagnosis not present

## 2016-10-08 DIAGNOSIS — I10 Essential (primary) hypertension: Secondary | ICD-10-CM | POA: Diagnosis not present

## 2016-10-08 DIAGNOSIS — C3412 Malignant neoplasm of upper lobe, left bronchus or lung: Secondary | ICD-10-CM | POA: Diagnosis not present

## 2016-10-08 DIAGNOSIS — M109 Gout, unspecified: Secondary | ICD-10-CM | POA: Diagnosis not present

## 2016-10-08 DIAGNOSIS — Z9181 History of falling: Secondary | ICD-10-CM | POA: Diagnosis not present

## 2016-10-08 DIAGNOSIS — Z931 Gastrostomy status: Secondary | ICD-10-CM | POA: Diagnosis not present

## 2016-10-10 DIAGNOSIS — E119 Type 2 diabetes mellitus without complications: Secondary | ICD-10-CM | POA: Diagnosis not present

## 2016-10-10 DIAGNOSIS — Z9181 History of falling: Secondary | ICD-10-CM | POA: Diagnosis not present

## 2016-10-10 DIAGNOSIS — Z931 Gastrostomy status: Secondary | ICD-10-CM | POA: Diagnosis not present

## 2016-10-10 DIAGNOSIS — Z7984 Long term (current) use of oral hypoglycemic drugs: Secondary | ICD-10-CM | POA: Diagnosis not present

## 2016-10-10 DIAGNOSIS — Z87891 Personal history of nicotine dependence: Secondary | ICD-10-CM | POA: Diagnosis not present

## 2016-10-10 DIAGNOSIS — J449 Chronic obstructive pulmonary disease, unspecified: Secondary | ICD-10-CM | POA: Diagnosis not present

## 2016-10-10 DIAGNOSIS — C3412 Malignant neoplasm of upper lobe, left bronchus or lung: Secondary | ICD-10-CM | POA: Diagnosis not present

## 2016-10-10 DIAGNOSIS — M109 Gout, unspecified: Secondary | ICD-10-CM | POA: Diagnosis not present

## 2016-10-10 DIAGNOSIS — I10 Essential (primary) hypertension: Secondary | ICD-10-CM | POA: Diagnosis not present

## 2016-10-13 DIAGNOSIS — M109 Gout, unspecified: Secondary | ICD-10-CM | POA: Diagnosis not present

## 2016-10-13 DIAGNOSIS — Z7984 Long term (current) use of oral hypoglycemic drugs: Secondary | ICD-10-CM | POA: Diagnosis not present

## 2016-10-13 DIAGNOSIS — Z931 Gastrostomy status: Secondary | ICD-10-CM | POA: Diagnosis not present

## 2016-10-13 DIAGNOSIS — E119 Type 2 diabetes mellitus without complications: Secondary | ICD-10-CM | POA: Diagnosis not present

## 2016-10-13 DIAGNOSIS — J449 Chronic obstructive pulmonary disease, unspecified: Secondary | ICD-10-CM | POA: Diagnosis not present

## 2016-10-13 DIAGNOSIS — Z9181 History of falling: Secondary | ICD-10-CM | POA: Diagnosis not present

## 2016-10-13 DIAGNOSIS — C3412 Malignant neoplasm of upper lobe, left bronchus or lung: Secondary | ICD-10-CM | POA: Diagnosis not present

## 2016-10-13 DIAGNOSIS — Z87891 Personal history of nicotine dependence: Secondary | ICD-10-CM | POA: Diagnosis not present

## 2016-10-13 DIAGNOSIS — I10 Essential (primary) hypertension: Secondary | ICD-10-CM | POA: Diagnosis not present

## 2016-10-15 DIAGNOSIS — Z95828 Presence of other vascular implants and grafts: Secondary | ICD-10-CM | POA: Diagnosis not present

## 2016-10-15 DIAGNOSIS — C3412 Malignant neoplasm of upper lobe, left bronchus or lung: Secondary | ICD-10-CM | POA: Diagnosis not present

## 2016-10-15 DIAGNOSIS — Z9221 Personal history of antineoplastic chemotherapy: Secondary | ICD-10-CM | POA: Diagnosis not present

## 2016-10-15 DIAGNOSIS — Z9981 Dependence on supplemental oxygen: Secondary | ICD-10-CM | POA: Diagnosis not present

## 2016-10-15 DIAGNOSIS — Z923 Personal history of irradiation: Secondary | ICD-10-CM | POA: Diagnosis not present

## 2016-10-15 DIAGNOSIS — R64 Cachexia: Secondary | ICD-10-CM | POA: Diagnosis not present

## 2016-10-16 DIAGNOSIS — Z7984 Long term (current) use of oral hypoglycemic drugs: Secondary | ICD-10-CM | POA: Diagnosis not present

## 2016-10-16 DIAGNOSIS — Z931 Gastrostomy status: Secondary | ICD-10-CM | POA: Diagnosis not present

## 2016-10-16 DIAGNOSIS — Z9181 History of falling: Secondary | ICD-10-CM | POA: Diagnosis not present

## 2016-10-16 DIAGNOSIS — M109 Gout, unspecified: Secondary | ICD-10-CM | POA: Diagnosis not present

## 2016-10-16 DIAGNOSIS — E119 Type 2 diabetes mellitus without complications: Secondary | ICD-10-CM | POA: Diagnosis not present

## 2016-10-16 DIAGNOSIS — C3412 Malignant neoplasm of upper lobe, left bronchus or lung: Secondary | ICD-10-CM | POA: Diagnosis not present

## 2016-10-16 DIAGNOSIS — J449 Chronic obstructive pulmonary disease, unspecified: Secondary | ICD-10-CM | POA: Diagnosis not present

## 2016-10-16 DIAGNOSIS — Z87891 Personal history of nicotine dependence: Secondary | ICD-10-CM | POA: Diagnosis not present

## 2016-10-16 DIAGNOSIS — I10 Essential (primary) hypertension: Secondary | ICD-10-CM | POA: Diagnosis not present

## 2016-10-20 DIAGNOSIS — C3412 Malignant neoplasm of upper lobe, left bronchus or lung: Secondary | ICD-10-CM | POA: Diagnosis not present

## 2016-10-20 DIAGNOSIS — M109 Gout, unspecified: Secondary | ICD-10-CM | POA: Diagnosis not present

## 2016-10-20 DIAGNOSIS — Z931 Gastrostomy status: Secondary | ICD-10-CM | POA: Diagnosis not present

## 2016-10-20 DIAGNOSIS — I10 Essential (primary) hypertension: Secondary | ICD-10-CM | POA: Diagnosis not present

## 2016-10-20 DIAGNOSIS — J449 Chronic obstructive pulmonary disease, unspecified: Secondary | ICD-10-CM | POA: Diagnosis not present

## 2016-10-20 DIAGNOSIS — E119 Type 2 diabetes mellitus without complications: Secondary | ICD-10-CM | POA: Diagnosis not present

## 2016-10-20 DIAGNOSIS — Z9181 History of falling: Secondary | ICD-10-CM | POA: Diagnosis not present

## 2016-10-20 DIAGNOSIS — Z7984 Long term (current) use of oral hypoglycemic drugs: Secondary | ICD-10-CM | POA: Diagnosis not present

## 2016-10-20 DIAGNOSIS — Z87891 Personal history of nicotine dependence: Secondary | ICD-10-CM | POA: Diagnosis not present

## 2016-10-22 DIAGNOSIS — I10 Essential (primary) hypertension: Secondary | ICD-10-CM | POA: Diagnosis not present

## 2016-10-22 DIAGNOSIS — E119 Type 2 diabetes mellitus without complications: Secondary | ICD-10-CM | POA: Diagnosis not present

## 2016-10-22 DIAGNOSIS — M109 Gout, unspecified: Secondary | ICD-10-CM | POA: Diagnosis not present

## 2016-10-22 DIAGNOSIS — J449 Chronic obstructive pulmonary disease, unspecified: Secondary | ICD-10-CM | POA: Diagnosis not present

## 2016-10-22 DIAGNOSIS — Z931 Gastrostomy status: Secondary | ICD-10-CM | POA: Diagnosis not present

## 2016-10-22 DIAGNOSIS — Z9181 History of falling: Secondary | ICD-10-CM | POA: Diagnosis not present

## 2016-10-22 DIAGNOSIS — Z7984 Long term (current) use of oral hypoglycemic drugs: Secondary | ICD-10-CM | POA: Diagnosis not present

## 2016-10-22 DIAGNOSIS — Z87891 Personal history of nicotine dependence: Secondary | ICD-10-CM | POA: Diagnosis not present

## 2016-10-22 DIAGNOSIS — C3412 Malignant neoplasm of upper lobe, left bronchus or lung: Secondary | ICD-10-CM | POA: Diagnosis not present

## 2016-10-27 DIAGNOSIS — M109 Gout, unspecified: Secondary | ICD-10-CM | POA: Diagnosis not present

## 2016-10-27 DIAGNOSIS — I10 Essential (primary) hypertension: Secondary | ICD-10-CM | POA: Diagnosis not present

## 2016-10-27 DIAGNOSIS — J449 Chronic obstructive pulmonary disease, unspecified: Secondary | ICD-10-CM | POA: Diagnosis not present

## 2016-10-27 DIAGNOSIS — Z87891 Personal history of nicotine dependence: Secondary | ICD-10-CM | POA: Diagnosis not present

## 2016-10-27 DIAGNOSIS — C3412 Malignant neoplasm of upper lobe, left bronchus or lung: Secondary | ICD-10-CM | POA: Diagnosis not present

## 2016-10-27 DIAGNOSIS — Z7984 Long term (current) use of oral hypoglycemic drugs: Secondary | ICD-10-CM | POA: Diagnosis not present

## 2016-10-27 DIAGNOSIS — Z931 Gastrostomy status: Secondary | ICD-10-CM | POA: Diagnosis not present

## 2016-10-27 DIAGNOSIS — E119 Type 2 diabetes mellitus without complications: Secondary | ICD-10-CM | POA: Diagnosis not present

## 2016-10-27 DIAGNOSIS — Z9181 History of falling: Secondary | ICD-10-CM | POA: Diagnosis not present

## 2016-10-30 DIAGNOSIS — Z9181 History of falling: Secondary | ICD-10-CM | POA: Diagnosis not present

## 2016-10-30 DIAGNOSIS — Z7984 Long term (current) use of oral hypoglycemic drugs: Secondary | ICD-10-CM | POA: Diagnosis not present

## 2016-10-30 DIAGNOSIS — I10 Essential (primary) hypertension: Secondary | ICD-10-CM | POA: Diagnosis not present

## 2016-10-30 DIAGNOSIS — M109 Gout, unspecified: Secondary | ICD-10-CM | POA: Diagnosis not present

## 2016-10-30 DIAGNOSIS — C3412 Malignant neoplasm of upper lobe, left bronchus or lung: Secondary | ICD-10-CM | POA: Diagnosis not present

## 2016-10-30 DIAGNOSIS — Z931 Gastrostomy status: Secondary | ICD-10-CM | POA: Diagnosis not present

## 2016-10-30 DIAGNOSIS — Z87891 Personal history of nicotine dependence: Secondary | ICD-10-CM | POA: Diagnosis not present

## 2016-10-30 DIAGNOSIS — J449 Chronic obstructive pulmonary disease, unspecified: Secondary | ICD-10-CM | POA: Diagnosis not present

## 2016-10-30 DIAGNOSIS — E119 Type 2 diabetes mellitus without complications: Secondary | ICD-10-CM | POA: Diagnosis not present

## 2016-11-03 DIAGNOSIS — Z931 Gastrostomy status: Secondary | ICD-10-CM | POA: Diagnosis not present

## 2016-11-03 DIAGNOSIS — C3412 Malignant neoplasm of upper lobe, left bronchus or lung: Secondary | ICD-10-CM | POA: Diagnosis not present

## 2016-11-03 DIAGNOSIS — E119 Type 2 diabetes mellitus without complications: Secondary | ICD-10-CM | POA: Diagnosis not present

## 2016-11-03 DIAGNOSIS — Z7984 Long term (current) use of oral hypoglycemic drugs: Secondary | ICD-10-CM | POA: Diagnosis not present

## 2016-11-03 DIAGNOSIS — M109 Gout, unspecified: Secondary | ICD-10-CM | POA: Diagnosis not present

## 2016-11-03 DIAGNOSIS — I10 Essential (primary) hypertension: Secondary | ICD-10-CM | POA: Diagnosis not present

## 2016-11-03 DIAGNOSIS — Z9181 History of falling: Secondary | ICD-10-CM | POA: Diagnosis not present

## 2016-11-03 DIAGNOSIS — Z87891 Personal history of nicotine dependence: Secondary | ICD-10-CM | POA: Diagnosis not present

## 2016-11-03 DIAGNOSIS — J449 Chronic obstructive pulmonary disease, unspecified: Secondary | ICD-10-CM | POA: Diagnosis not present

## 2016-11-05 DIAGNOSIS — R918 Other nonspecific abnormal finding of lung field: Secondary | ICD-10-CM | POA: Diagnosis not present

## 2016-11-06 DIAGNOSIS — M109 Gout, unspecified: Secondary | ICD-10-CM | POA: Diagnosis not present

## 2016-11-06 DIAGNOSIS — Z931 Gastrostomy status: Secondary | ICD-10-CM | POA: Diagnosis not present

## 2016-11-06 DIAGNOSIS — I10 Essential (primary) hypertension: Secondary | ICD-10-CM | POA: Diagnosis not present

## 2016-11-06 DIAGNOSIS — J449 Chronic obstructive pulmonary disease, unspecified: Secondary | ICD-10-CM | POA: Diagnosis not present

## 2016-11-06 DIAGNOSIS — Z7984 Long term (current) use of oral hypoglycemic drugs: Secondary | ICD-10-CM | POA: Diagnosis not present

## 2016-11-06 DIAGNOSIS — Z87891 Personal history of nicotine dependence: Secondary | ICD-10-CM | POA: Diagnosis not present

## 2016-11-06 DIAGNOSIS — E119 Type 2 diabetes mellitus without complications: Secondary | ICD-10-CM | POA: Diagnosis not present

## 2016-11-06 DIAGNOSIS — C3412 Malignant neoplasm of upper lobe, left bronchus or lung: Secondary | ICD-10-CM | POA: Diagnosis not present

## 2016-11-06 DIAGNOSIS — Z9181 History of falling: Secondary | ICD-10-CM | POA: Diagnosis not present

## 2016-11-08 IMAGING — CT CT CHEST W/ CM
2 of 3 series · 15 of 36 positions shown, 18 images · IV contrast (APPLIED)
Comparison: Chest x-ray of 04/24/2015 and 05/26/2013

CLINICAL DATA: Abnormal chest x-ray, shortness of breath, cough for
2 months

EXAM:
CT CHEST WITH CONTRAST
TECHNIQUE: Multidetector CT imaging of the chest was performed during
intravenous contrast administration.
CONTRAST:  75mL OMNIPAQUE IOHEXOL 300 MG/ML  SOLN

[Series 2: chest 5.0 b31f · axial · 0.83mm/px · z∈[-276,-12]mm · 12 of 63 slices shown, 15 images]
[im 5/63  mediastinal]
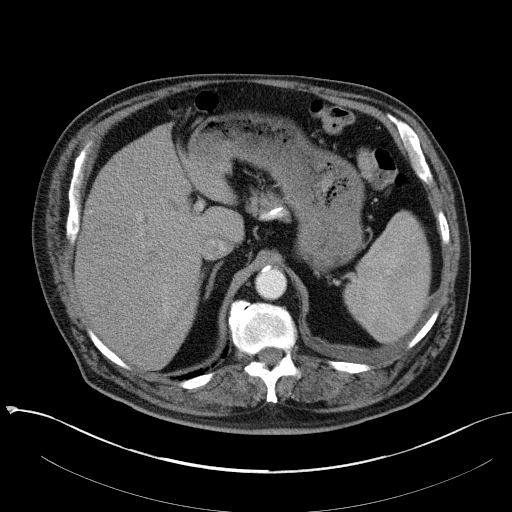
[im 5/63  lung]
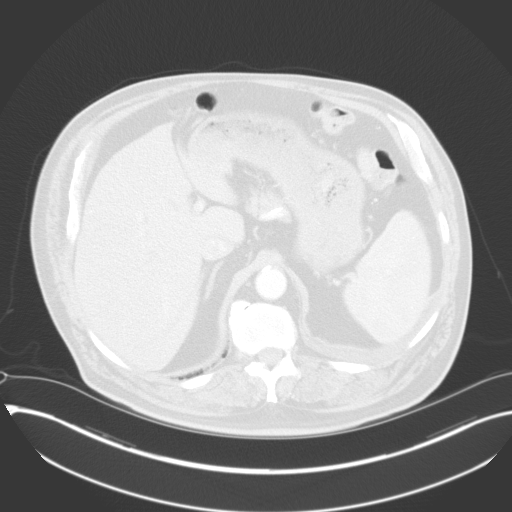
[im 10/63  lung]
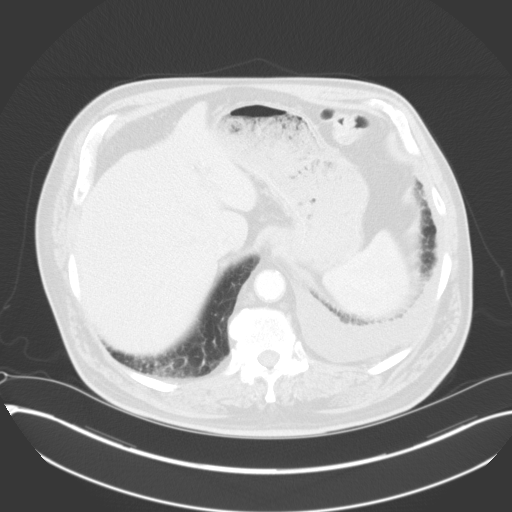
[im 14/63  lung]
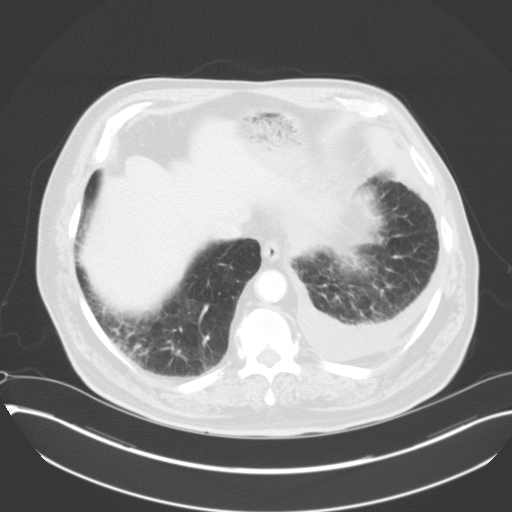
[im 19/63  lung]
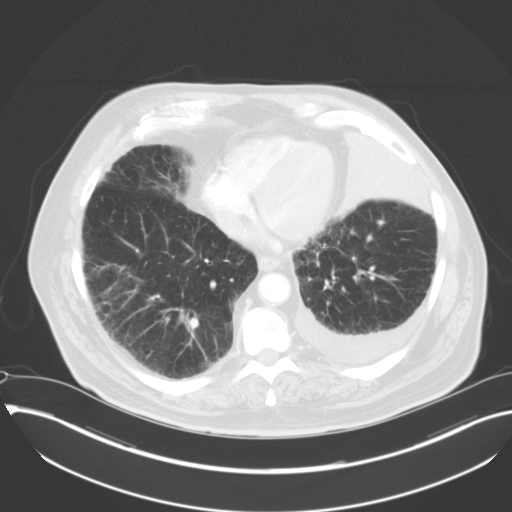
[im 23/63  mediastinal]
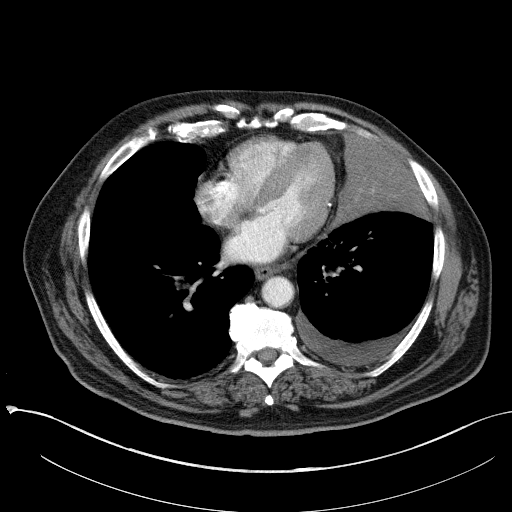
[im 23/63  lung]
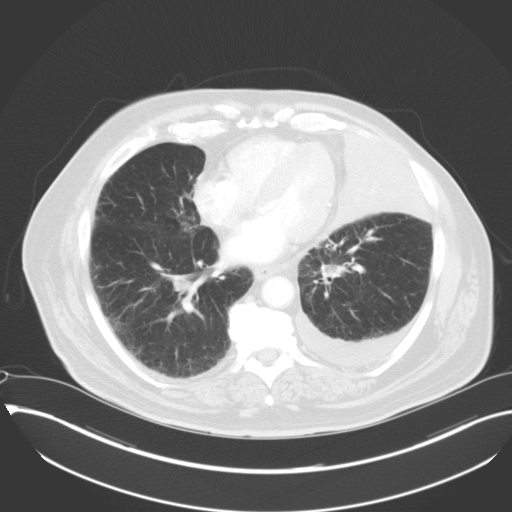
[im 28/63  lung]
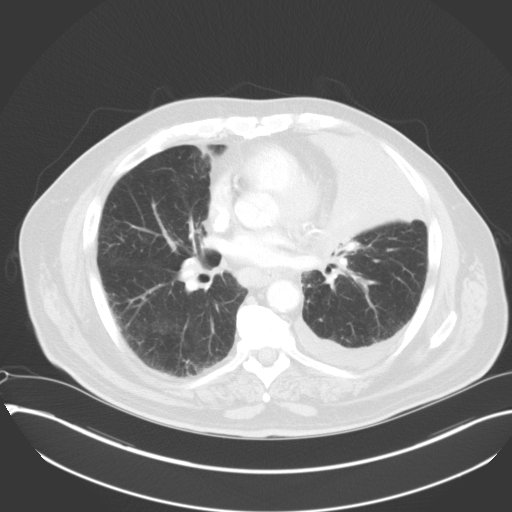
[im 35/63  lung]
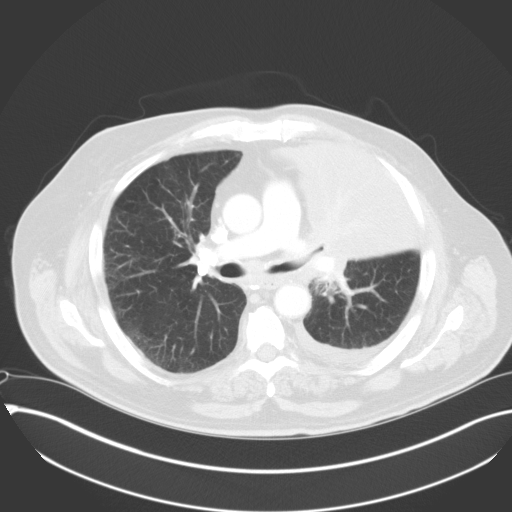
[im 40/63  lung]
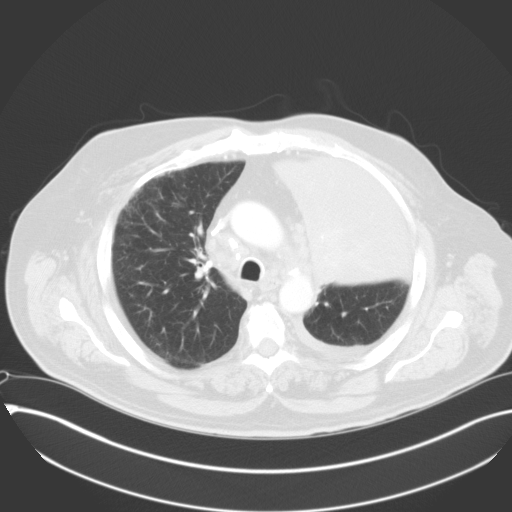
[im 44/63  mediastinal]
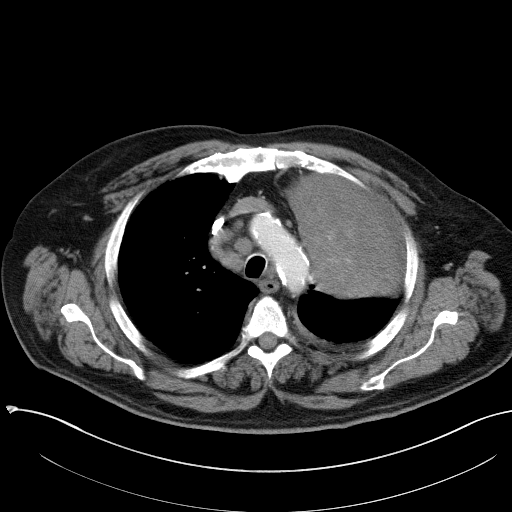
[im 44/63  lung]
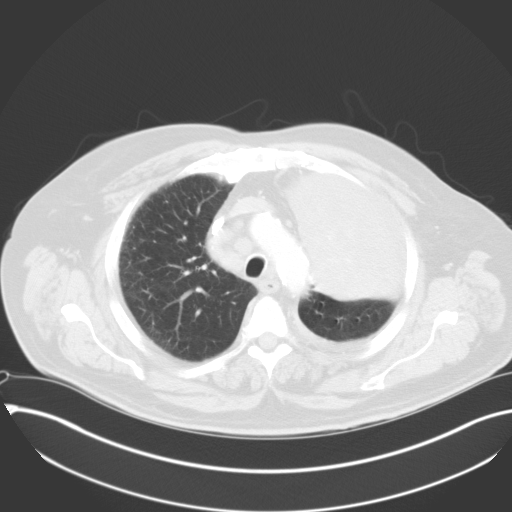
[im 49/63  lung]
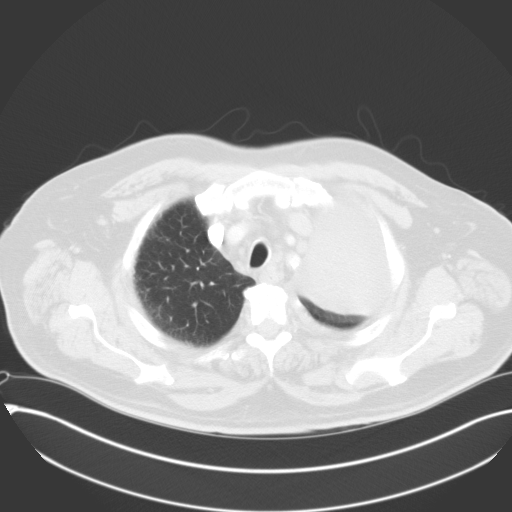
[im 53/63  lung]
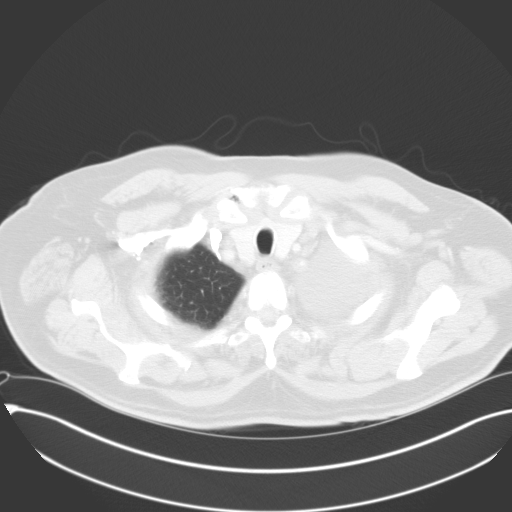
[im 58/63  lung]
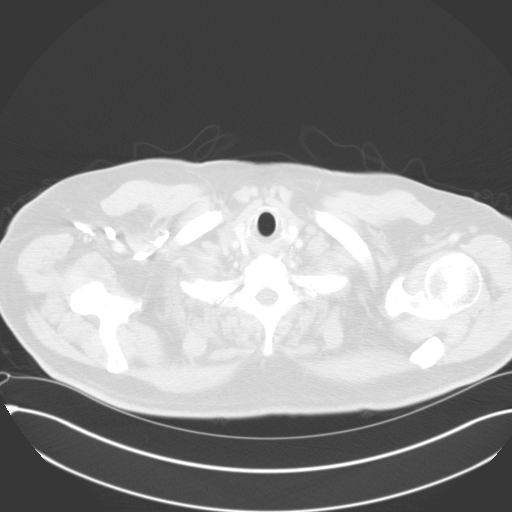

[Series 6: chest 3.0 coronal · coronal · 0.69mm/px · 3 of 99 slices shown]
[im 20/99  lung]
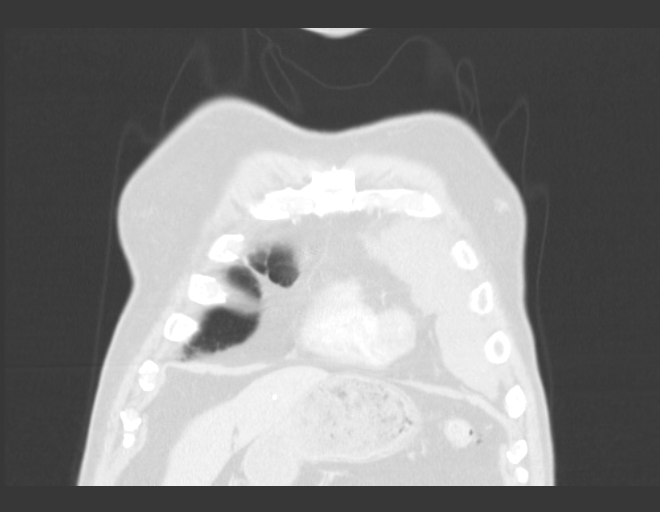
[im 40/99  lung]
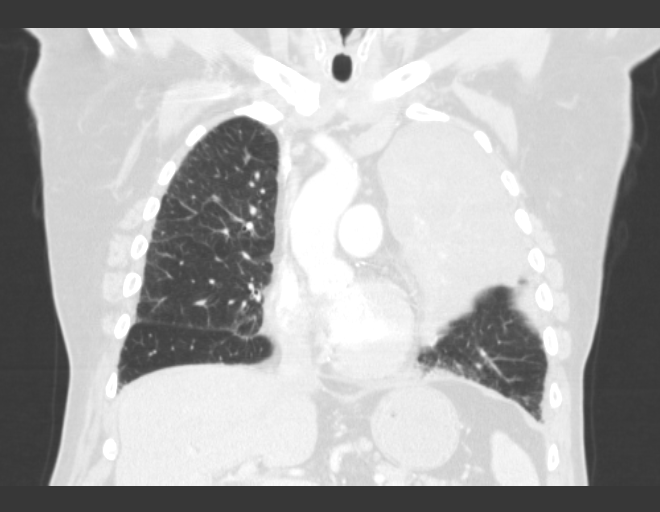
[im 59/99  lung]
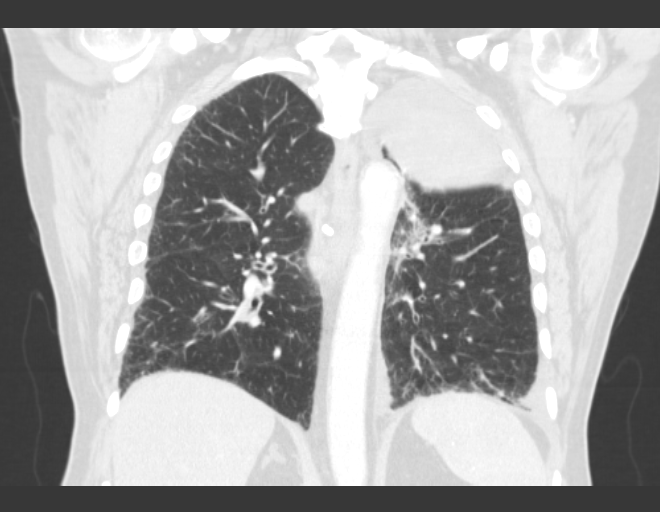

[15 of 36 positions shown; findings below may reference images not displayed]

FINDINGS: On lung window images, the abnormal opacity involving the left hemi
thorax is due to collapse of the left upper lobe and lingula. The
bronchus to the left upper lobe abruptly terminates centrally. These
findings are worrisome for carcinoma involving the left upper lobe
bronchus. Bronchoscopy may be helpful to evaluate further. There is
a small left pleural effusion present. There is mediastinal and
probable left hilar adenopathy present. The largest cluster of nodes
is sub- carinal position measuring 22 mm in short axis diameter. A
paratracheal node on image 18 measures 13 mm in short axis diameter.
Additional prominent nodes are present within the mediastinum.
Diffuse Coronary artery calcifications are present. Calcified right
hilar and mediastinal nodes are also noted consistent with a prior
granulomatous disease. A calcified granuloma is present within the
right lower lobe. There are calcified hepatic and splenic granulomas
consistent with prior granulomatous disease as well. Otherwise the
images through the upper abdomen are unremarkable.

On lung window images, no focal infiltrate is seen. Some subpleural
fibrosis is noted most consistent with a chronic interstitial
process. The thoracic vertebrae are in normal alignment.
IMPRESSION: 1. Collapse of the left upper lobe. This is suspicious for a central
endobronchial process and bronchoscopy is recommended.
2. Small left pleural effusion.
3. Mediastinal and possibly left hilar adenopathy.
4. Changes of prior granulomatous disease.

## 2016-11-12 DIAGNOSIS — J449 Chronic obstructive pulmonary disease, unspecified: Secondary | ICD-10-CM | POA: Diagnosis not present

## 2016-11-12 DIAGNOSIS — Z9181 History of falling: Secondary | ICD-10-CM | POA: Diagnosis not present

## 2016-11-12 DIAGNOSIS — Z87891 Personal history of nicotine dependence: Secondary | ICD-10-CM | POA: Diagnosis not present

## 2016-11-12 DIAGNOSIS — C3412 Malignant neoplasm of upper lobe, left bronchus or lung: Secondary | ICD-10-CM | POA: Diagnosis not present

## 2016-11-12 DIAGNOSIS — Z931 Gastrostomy status: Secondary | ICD-10-CM | POA: Diagnosis not present

## 2016-11-12 DIAGNOSIS — I10 Essential (primary) hypertension: Secondary | ICD-10-CM | POA: Diagnosis not present

## 2016-11-12 DIAGNOSIS — E119 Type 2 diabetes mellitus without complications: Secondary | ICD-10-CM | POA: Diagnosis not present

## 2016-11-12 DIAGNOSIS — M109 Gout, unspecified: Secondary | ICD-10-CM | POA: Diagnosis not present

## 2016-11-12 DIAGNOSIS — Z7984 Long term (current) use of oral hypoglycemic drugs: Secondary | ICD-10-CM | POA: Diagnosis not present

## 2016-11-13 DIAGNOSIS — S51801A Unspecified open wound of right forearm, initial encounter: Secondary | ICD-10-CM | POA: Diagnosis not present

## 2016-11-14 DIAGNOSIS — C3412 Malignant neoplasm of upper lobe, left bronchus or lung: Secondary | ICD-10-CM | POA: Diagnosis not present

## 2016-11-14 DIAGNOSIS — Z931 Gastrostomy status: Secondary | ICD-10-CM | POA: Diagnosis not present

## 2016-11-14 DIAGNOSIS — M109 Gout, unspecified: Secondary | ICD-10-CM | POA: Diagnosis not present

## 2016-11-14 DIAGNOSIS — Z87891 Personal history of nicotine dependence: Secondary | ICD-10-CM | POA: Diagnosis not present

## 2016-11-14 DIAGNOSIS — J449 Chronic obstructive pulmonary disease, unspecified: Secondary | ICD-10-CM | POA: Diagnosis not present

## 2016-11-14 DIAGNOSIS — I10 Essential (primary) hypertension: Secondary | ICD-10-CM | POA: Diagnosis not present

## 2016-11-14 DIAGNOSIS — E119 Type 2 diabetes mellitus without complications: Secondary | ICD-10-CM | POA: Diagnosis not present

## 2016-11-14 DIAGNOSIS — Z7984 Long term (current) use of oral hypoglycemic drugs: Secondary | ICD-10-CM | POA: Diagnosis not present

## 2016-11-14 DIAGNOSIS — Z9181 History of falling: Secondary | ICD-10-CM | POA: Diagnosis not present

## 2016-11-15 DIAGNOSIS — R64 Cachexia: Secondary | ICD-10-CM | POA: Diagnosis not present

## 2016-11-18 DIAGNOSIS — J449 Chronic obstructive pulmonary disease, unspecified: Secondary | ICD-10-CM | POA: Diagnosis not present

## 2016-11-18 DIAGNOSIS — C3412 Malignant neoplasm of upper lobe, left bronchus or lung: Secondary | ICD-10-CM | POA: Diagnosis not present

## 2016-11-18 DIAGNOSIS — Z931 Gastrostomy status: Secondary | ICD-10-CM | POA: Diagnosis not present

## 2016-11-18 DIAGNOSIS — E119 Type 2 diabetes mellitus without complications: Secondary | ICD-10-CM | POA: Diagnosis not present

## 2016-11-18 DIAGNOSIS — Z9181 History of falling: Secondary | ICD-10-CM | POA: Diagnosis not present

## 2016-11-18 DIAGNOSIS — Z7984 Long term (current) use of oral hypoglycemic drugs: Secondary | ICD-10-CM | POA: Diagnosis not present

## 2016-11-18 DIAGNOSIS — I10 Essential (primary) hypertension: Secondary | ICD-10-CM | POA: Diagnosis not present

## 2016-11-18 DIAGNOSIS — Z87891 Personal history of nicotine dependence: Secondary | ICD-10-CM | POA: Diagnosis not present

## 2016-11-18 DIAGNOSIS — M109 Gout, unspecified: Secondary | ICD-10-CM | POA: Diagnosis not present

## 2016-11-20 DIAGNOSIS — C3412 Malignant neoplasm of upper lobe, left bronchus or lung: Secondary | ICD-10-CM | POA: Diagnosis not present

## 2016-11-20 DIAGNOSIS — Z9181 History of falling: Secondary | ICD-10-CM | POA: Diagnosis not present

## 2016-11-20 DIAGNOSIS — Z87891 Personal history of nicotine dependence: Secondary | ICD-10-CM | POA: Diagnosis not present

## 2016-11-20 DIAGNOSIS — E119 Type 2 diabetes mellitus without complications: Secondary | ICD-10-CM | POA: Diagnosis not present

## 2016-11-20 DIAGNOSIS — I10 Essential (primary) hypertension: Secondary | ICD-10-CM | POA: Diagnosis not present

## 2016-11-20 DIAGNOSIS — Z931 Gastrostomy status: Secondary | ICD-10-CM | POA: Diagnosis not present

## 2016-11-20 DIAGNOSIS — J449 Chronic obstructive pulmonary disease, unspecified: Secondary | ICD-10-CM | POA: Diagnosis not present

## 2016-11-20 DIAGNOSIS — M109 Gout, unspecified: Secondary | ICD-10-CM | POA: Diagnosis not present

## 2016-11-20 DIAGNOSIS — Z7984 Long term (current) use of oral hypoglycemic drugs: Secondary | ICD-10-CM | POA: Diagnosis not present

## 2016-11-25 DIAGNOSIS — Z7984 Long term (current) use of oral hypoglycemic drugs: Secondary | ICD-10-CM | POA: Diagnosis not present

## 2016-11-25 DIAGNOSIS — J449 Chronic obstructive pulmonary disease, unspecified: Secondary | ICD-10-CM | POA: Diagnosis not present

## 2016-11-25 DIAGNOSIS — I10 Essential (primary) hypertension: Secondary | ICD-10-CM | POA: Diagnosis not present

## 2016-11-25 DIAGNOSIS — M109 Gout, unspecified: Secondary | ICD-10-CM | POA: Diagnosis not present

## 2016-11-25 DIAGNOSIS — Z931 Gastrostomy status: Secondary | ICD-10-CM | POA: Diagnosis not present

## 2016-11-25 DIAGNOSIS — C3412 Malignant neoplasm of upper lobe, left bronchus or lung: Secondary | ICD-10-CM | POA: Diagnosis not present

## 2016-11-25 DIAGNOSIS — Z9181 History of falling: Secondary | ICD-10-CM | POA: Diagnosis not present

## 2016-11-25 DIAGNOSIS — Z87891 Personal history of nicotine dependence: Secondary | ICD-10-CM | POA: Diagnosis not present

## 2016-11-25 DIAGNOSIS — E119 Type 2 diabetes mellitus without complications: Secondary | ICD-10-CM | POA: Diagnosis not present

## 2016-11-26 DIAGNOSIS — D6481 Anemia due to antineoplastic chemotherapy: Secondary | ICD-10-CM | POA: Diagnosis not present

## 2016-11-26 DIAGNOSIS — Z923 Personal history of irradiation: Secondary | ICD-10-CM | POA: Diagnosis not present

## 2016-11-26 DIAGNOSIS — E119 Type 2 diabetes mellitus without complications: Secondary | ICD-10-CM | POA: Diagnosis not present

## 2016-11-26 DIAGNOSIS — J449 Chronic obstructive pulmonary disease, unspecified: Secondary | ICD-10-CM | POA: Diagnosis not present

## 2016-11-26 DIAGNOSIS — C3412 Malignant neoplasm of upper lobe, left bronchus or lung: Secondary | ICD-10-CM | POA: Diagnosis not present

## 2016-11-26 DIAGNOSIS — D72810 Lymphocytopenia: Secondary | ICD-10-CM | POA: Diagnosis not present

## 2016-11-26 DIAGNOSIS — Z9221 Personal history of antineoplastic chemotherapy: Secondary | ICD-10-CM | POA: Diagnosis not present

## 2016-11-26 DIAGNOSIS — J9611 Chronic respiratory failure with hypoxia: Secondary | ICD-10-CM | POA: Diagnosis not present

## 2016-11-26 DIAGNOSIS — I5022 Chronic systolic (congestive) heart failure: Secondary | ICD-10-CM | POA: Diagnosis not present

## 2016-11-26 DIAGNOSIS — D649 Anemia, unspecified: Secondary | ICD-10-CM | POA: Diagnosis not present

## 2016-11-26 DIAGNOSIS — T451X5S Adverse effect of antineoplastic and immunosuppressive drugs, sequela: Secondary | ICD-10-CM | POA: Diagnosis not present

## 2016-11-27 DIAGNOSIS — Z87891 Personal history of nicotine dependence: Secondary | ICD-10-CM | POA: Diagnosis not present

## 2016-11-27 DIAGNOSIS — Z931 Gastrostomy status: Secondary | ICD-10-CM | POA: Diagnosis not present

## 2016-11-27 DIAGNOSIS — Z9181 History of falling: Secondary | ICD-10-CM | POA: Diagnosis not present

## 2016-11-27 DIAGNOSIS — Z7984 Long term (current) use of oral hypoglycemic drugs: Secondary | ICD-10-CM | POA: Diagnosis not present

## 2016-11-27 DIAGNOSIS — C3412 Malignant neoplasm of upper lobe, left bronchus or lung: Secondary | ICD-10-CM | POA: Diagnosis not present

## 2016-11-27 DIAGNOSIS — M109 Gout, unspecified: Secondary | ICD-10-CM | POA: Diagnosis not present

## 2016-11-27 DIAGNOSIS — J449 Chronic obstructive pulmonary disease, unspecified: Secondary | ICD-10-CM | POA: Diagnosis not present

## 2016-11-27 DIAGNOSIS — I10 Essential (primary) hypertension: Secondary | ICD-10-CM | POA: Diagnosis not present

## 2016-11-27 DIAGNOSIS — E119 Type 2 diabetes mellitus without complications: Secondary | ICD-10-CM | POA: Diagnosis not present

## 2016-12-02 DIAGNOSIS — E119 Type 2 diabetes mellitus without complications: Secondary | ICD-10-CM | POA: Diagnosis not present

## 2016-12-02 DIAGNOSIS — C3412 Malignant neoplasm of upper lobe, left bronchus or lung: Secondary | ICD-10-CM | POA: Diagnosis not present

## 2016-12-02 DIAGNOSIS — Z9181 History of falling: Secondary | ICD-10-CM | POA: Diagnosis not present

## 2016-12-02 DIAGNOSIS — I10 Essential (primary) hypertension: Secondary | ICD-10-CM | POA: Diagnosis not present

## 2016-12-02 DIAGNOSIS — Z7984 Long term (current) use of oral hypoglycemic drugs: Secondary | ICD-10-CM | POA: Diagnosis not present

## 2016-12-02 DIAGNOSIS — Z931 Gastrostomy status: Secondary | ICD-10-CM | POA: Diagnosis not present

## 2016-12-02 DIAGNOSIS — J449 Chronic obstructive pulmonary disease, unspecified: Secondary | ICD-10-CM | POA: Diagnosis not present

## 2016-12-02 DIAGNOSIS — M109 Gout, unspecified: Secondary | ICD-10-CM | POA: Diagnosis not present

## 2016-12-02 DIAGNOSIS — Z87891 Personal history of nicotine dependence: Secondary | ICD-10-CM | POA: Diagnosis not present

## 2016-12-04 DIAGNOSIS — Z9181 History of falling: Secondary | ICD-10-CM | POA: Diagnosis not present

## 2016-12-04 DIAGNOSIS — J449 Chronic obstructive pulmonary disease, unspecified: Secondary | ICD-10-CM | POA: Diagnosis not present

## 2016-12-04 DIAGNOSIS — Z7984 Long term (current) use of oral hypoglycemic drugs: Secondary | ICD-10-CM | POA: Diagnosis not present

## 2016-12-04 DIAGNOSIS — I10 Essential (primary) hypertension: Secondary | ICD-10-CM | POA: Diagnosis not present

## 2016-12-04 DIAGNOSIS — C3412 Malignant neoplasm of upper lobe, left bronchus or lung: Secondary | ICD-10-CM | POA: Diagnosis not present

## 2016-12-04 DIAGNOSIS — Z931 Gastrostomy status: Secondary | ICD-10-CM | POA: Diagnosis not present

## 2016-12-04 DIAGNOSIS — E119 Type 2 diabetes mellitus without complications: Secondary | ICD-10-CM | POA: Diagnosis not present

## 2016-12-04 DIAGNOSIS — M109 Gout, unspecified: Secondary | ICD-10-CM | POA: Diagnosis not present

## 2016-12-04 DIAGNOSIS — Z87891 Personal history of nicotine dependence: Secondary | ICD-10-CM | POA: Diagnosis not present

## 2016-12-06 DIAGNOSIS — R918 Other nonspecific abnormal finding of lung field: Secondary | ICD-10-CM | POA: Diagnosis not present

## 2016-12-08 DIAGNOSIS — J449 Chronic obstructive pulmonary disease, unspecified: Secondary | ICD-10-CM | POA: Diagnosis not present

## 2016-12-08 DIAGNOSIS — Z9181 History of falling: Secondary | ICD-10-CM | POA: Diagnosis not present

## 2016-12-08 DIAGNOSIS — Z87891 Personal history of nicotine dependence: Secondary | ICD-10-CM | POA: Diagnosis not present

## 2016-12-08 DIAGNOSIS — E119 Type 2 diabetes mellitus without complications: Secondary | ICD-10-CM | POA: Diagnosis not present

## 2016-12-08 DIAGNOSIS — I10 Essential (primary) hypertension: Secondary | ICD-10-CM | POA: Diagnosis not present

## 2016-12-08 DIAGNOSIS — Z931 Gastrostomy status: Secondary | ICD-10-CM | POA: Diagnosis not present

## 2016-12-08 DIAGNOSIS — C3412 Malignant neoplasm of upper lobe, left bronchus or lung: Secondary | ICD-10-CM | POA: Diagnosis not present

## 2016-12-08 DIAGNOSIS — Z7984 Long term (current) use of oral hypoglycemic drugs: Secondary | ICD-10-CM | POA: Diagnosis not present

## 2016-12-08 DIAGNOSIS — M109 Gout, unspecified: Secondary | ICD-10-CM | POA: Diagnosis not present

## 2016-12-10 DIAGNOSIS — I10 Essential (primary) hypertension: Secondary | ICD-10-CM | POA: Diagnosis not present

## 2016-12-10 DIAGNOSIS — Z87891 Personal history of nicotine dependence: Secondary | ICD-10-CM | POA: Diagnosis not present

## 2016-12-10 DIAGNOSIS — Z931 Gastrostomy status: Secondary | ICD-10-CM | POA: Diagnosis not present

## 2016-12-10 DIAGNOSIS — J449 Chronic obstructive pulmonary disease, unspecified: Secondary | ICD-10-CM | POA: Diagnosis not present

## 2016-12-10 DIAGNOSIS — E119 Type 2 diabetes mellitus without complications: Secondary | ICD-10-CM | POA: Diagnosis not present

## 2016-12-10 DIAGNOSIS — M109 Gout, unspecified: Secondary | ICD-10-CM | POA: Diagnosis not present

## 2016-12-10 DIAGNOSIS — C3412 Malignant neoplasm of upper lobe, left bronchus or lung: Secondary | ICD-10-CM | POA: Diagnosis not present

## 2016-12-10 DIAGNOSIS — Z7984 Long term (current) use of oral hypoglycemic drugs: Secondary | ICD-10-CM | POA: Diagnosis not present

## 2016-12-10 DIAGNOSIS — Z9181 History of falling: Secondary | ICD-10-CM | POA: Diagnosis not present

## 2016-12-15 DIAGNOSIS — Z931 Gastrostomy status: Secondary | ICD-10-CM | POA: Diagnosis not present

## 2016-12-15 DIAGNOSIS — Z87891 Personal history of nicotine dependence: Secondary | ICD-10-CM | POA: Diagnosis not present

## 2016-12-15 DIAGNOSIS — E119 Type 2 diabetes mellitus without complications: Secondary | ICD-10-CM | POA: Diagnosis not present

## 2016-12-15 DIAGNOSIS — C3412 Malignant neoplasm of upper lobe, left bronchus or lung: Secondary | ICD-10-CM | POA: Diagnosis not present

## 2016-12-15 DIAGNOSIS — Z7984 Long term (current) use of oral hypoglycemic drugs: Secondary | ICD-10-CM | POA: Diagnosis not present

## 2016-12-15 DIAGNOSIS — M109 Gout, unspecified: Secondary | ICD-10-CM | POA: Diagnosis not present

## 2016-12-15 DIAGNOSIS — J449 Chronic obstructive pulmonary disease, unspecified: Secondary | ICD-10-CM | POA: Diagnosis not present

## 2016-12-15 DIAGNOSIS — I10 Essential (primary) hypertension: Secondary | ICD-10-CM | POA: Diagnosis not present

## 2016-12-15 DIAGNOSIS — Z9181 History of falling: Secondary | ICD-10-CM | POA: Diagnosis not present

## 2016-12-18 DIAGNOSIS — Z87891 Personal history of nicotine dependence: Secondary | ICD-10-CM | POA: Diagnosis not present

## 2016-12-18 DIAGNOSIS — I10 Essential (primary) hypertension: Secondary | ICD-10-CM | POA: Diagnosis not present

## 2016-12-18 DIAGNOSIS — J449 Chronic obstructive pulmonary disease, unspecified: Secondary | ICD-10-CM | POA: Diagnosis not present

## 2016-12-18 DIAGNOSIS — Z931 Gastrostomy status: Secondary | ICD-10-CM | POA: Diagnosis not present

## 2016-12-18 DIAGNOSIS — Z7984 Long term (current) use of oral hypoglycemic drugs: Secondary | ICD-10-CM | POA: Diagnosis not present

## 2016-12-18 DIAGNOSIS — Z9181 History of falling: Secondary | ICD-10-CM | POA: Diagnosis not present

## 2016-12-18 DIAGNOSIS — C3412 Malignant neoplasm of upper lobe, left bronchus or lung: Secondary | ICD-10-CM | POA: Diagnosis not present

## 2016-12-18 DIAGNOSIS — E119 Type 2 diabetes mellitus without complications: Secondary | ICD-10-CM | POA: Diagnosis not present

## 2016-12-18 DIAGNOSIS — M109 Gout, unspecified: Secondary | ICD-10-CM | POA: Diagnosis not present

## 2016-12-22 DIAGNOSIS — C3412 Malignant neoplasm of upper lobe, left bronchus or lung: Secondary | ICD-10-CM | POA: Diagnosis not present

## 2016-12-22 DIAGNOSIS — M109 Gout, unspecified: Secondary | ICD-10-CM | POA: Diagnosis not present

## 2016-12-22 DIAGNOSIS — E119 Type 2 diabetes mellitus without complications: Secondary | ICD-10-CM | POA: Diagnosis not present

## 2016-12-22 DIAGNOSIS — J449 Chronic obstructive pulmonary disease, unspecified: Secondary | ICD-10-CM | POA: Diagnosis not present

## 2016-12-22 DIAGNOSIS — Z9181 History of falling: Secondary | ICD-10-CM | POA: Diagnosis not present

## 2016-12-22 DIAGNOSIS — Z931 Gastrostomy status: Secondary | ICD-10-CM | POA: Diagnosis not present

## 2016-12-22 DIAGNOSIS — Z7984 Long term (current) use of oral hypoglycemic drugs: Secondary | ICD-10-CM | POA: Diagnosis not present

## 2016-12-22 DIAGNOSIS — Z87891 Personal history of nicotine dependence: Secondary | ICD-10-CM | POA: Diagnosis not present

## 2016-12-22 DIAGNOSIS — I10 Essential (primary) hypertension: Secondary | ICD-10-CM | POA: Diagnosis not present

## 2016-12-25 DIAGNOSIS — Z87891 Personal history of nicotine dependence: Secondary | ICD-10-CM | POA: Diagnosis not present

## 2016-12-25 DIAGNOSIS — M109 Gout, unspecified: Secondary | ICD-10-CM | POA: Diagnosis not present

## 2016-12-25 DIAGNOSIS — Z7984 Long term (current) use of oral hypoglycemic drugs: Secondary | ICD-10-CM | POA: Diagnosis not present

## 2016-12-25 DIAGNOSIS — Z931 Gastrostomy status: Secondary | ICD-10-CM | POA: Diagnosis not present

## 2016-12-25 DIAGNOSIS — Z9181 History of falling: Secondary | ICD-10-CM | POA: Diagnosis not present

## 2016-12-25 DIAGNOSIS — C3412 Malignant neoplasm of upper lobe, left bronchus or lung: Secondary | ICD-10-CM | POA: Diagnosis not present

## 2016-12-25 DIAGNOSIS — J449 Chronic obstructive pulmonary disease, unspecified: Secondary | ICD-10-CM | POA: Diagnosis not present

## 2016-12-25 DIAGNOSIS — I10 Essential (primary) hypertension: Secondary | ICD-10-CM | POA: Diagnosis not present

## 2016-12-25 DIAGNOSIS — E119 Type 2 diabetes mellitus without complications: Secondary | ICD-10-CM | POA: Diagnosis not present

## 2016-12-29 DIAGNOSIS — M109 Gout, unspecified: Secondary | ICD-10-CM | POA: Diagnosis not present

## 2016-12-29 DIAGNOSIS — C3412 Malignant neoplasm of upper lobe, left bronchus or lung: Secondary | ICD-10-CM | POA: Diagnosis not present

## 2016-12-29 DIAGNOSIS — I10 Essential (primary) hypertension: Secondary | ICD-10-CM | POA: Diagnosis not present

## 2016-12-29 DIAGNOSIS — Z87891 Personal history of nicotine dependence: Secondary | ICD-10-CM | POA: Diagnosis not present

## 2016-12-29 DIAGNOSIS — Z931 Gastrostomy status: Secondary | ICD-10-CM | POA: Diagnosis not present

## 2016-12-29 DIAGNOSIS — J449 Chronic obstructive pulmonary disease, unspecified: Secondary | ICD-10-CM | POA: Diagnosis not present

## 2016-12-29 DIAGNOSIS — Z7984 Long term (current) use of oral hypoglycemic drugs: Secondary | ICD-10-CM | POA: Diagnosis not present

## 2016-12-29 DIAGNOSIS — Z9181 History of falling: Secondary | ICD-10-CM | POA: Diagnosis not present

## 2016-12-29 DIAGNOSIS — E119 Type 2 diabetes mellitus without complications: Secondary | ICD-10-CM | POA: Diagnosis not present

## 2016-12-31 DIAGNOSIS — I10 Essential (primary) hypertension: Secondary | ICD-10-CM | POA: Diagnosis not present

## 2016-12-31 DIAGNOSIS — Z87891 Personal history of nicotine dependence: Secondary | ICD-10-CM | POA: Diagnosis not present

## 2016-12-31 DIAGNOSIS — M109 Gout, unspecified: Secondary | ICD-10-CM | POA: Diagnosis not present

## 2016-12-31 DIAGNOSIS — J449 Chronic obstructive pulmonary disease, unspecified: Secondary | ICD-10-CM | POA: Diagnosis not present

## 2016-12-31 DIAGNOSIS — Z9181 History of falling: Secondary | ICD-10-CM | POA: Diagnosis not present

## 2016-12-31 DIAGNOSIS — E119 Type 2 diabetes mellitus without complications: Secondary | ICD-10-CM | POA: Diagnosis not present

## 2016-12-31 DIAGNOSIS — Z7984 Long term (current) use of oral hypoglycemic drugs: Secondary | ICD-10-CM | POA: Diagnosis not present

## 2016-12-31 DIAGNOSIS — Z931 Gastrostomy status: Secondary | ICD-10-CM | POA: Diagnosis not present

## 2016-12-31 DIAGNOSIS — C3412 Malignant neoplasm of upper lobe, left bronchus or lung: Secondary | ICD-10-CM | POA: Diagnosis not present

## 2017-01-05 DIAGNOSIS — R918 Other nonspecific abnormal finding of lung field: Secondary | ICD-10-CM | POA: Diagnosis not present

## 2017-01-06 DIAGNOSIS — J449 Chronic obstructive pulmonary disease, unspecified: Secondary | ICD-10-CM | POA: Diagnosis not present

## 2017-01-06 DIAGNOSIS — Z9181 History of falling: Secondary | ICD-10-CM | POA: Diagnosis not present

## 2017-01-06 DIAGNOSIS — Z7984 Long term (current) use of oral hypoglycemic drugs: Secondary | ICD-10-CM | POA: Diagnosis not present

## 2017-01-06 DIAGNOSIS — E119 Type 2 diabetes mellitus without complications: Secondary | ICD-10-CM | POA: Diagnosis not present

## 2017-01-06 DIAGNOSIS — Z931 Gastrostomy status: Secondary | ICD-10-CM | POA: Diagnosis not present

## 2017-01-06 DIAGNOSIS — C3412 Malignant neoplasm of upper lobe, left bronchus or lung: Secondary | ICD-10-CM | POA: Diagnosis not present

## 2017-01-06 DIAGNOSIS — M109 Gout, unspecified: Secondary | ICD-10-CM | POA: Diagnosis not present

## 2017-01-06 DIAGNOSIS — I10 Essential (primary) hypertension: Secondary | ICD-10-CM | POA: Diagnosis not present

## 2017-01-06 DIAGNOSIS — Z87891 Personal history of nicotine dependence: Secondary | ICD-10-CM | POA: Diagnosis not present

## 2017-01-09 DIAGNOSIS — Z87891 Personal history of nicotine dependence: Secondary | ICD-10-CM | POA: Diagnosis not present

## 2017-01-09 DIAGNOSIS — Z7984 Long term (current) use of oral hypoglycemic drugs: Secondary | ICD-10-CM | POA: Diagnosis not present

## 2017-01-09 DIAGNOSIS — Z9181 History of falling: Secondary | ICD-10-CM | POA: Diagnosis not present

## 2017-01-09 DIAGNOSIS — C3412 Malignant neoplasm of upper lobe, left bronchus or lung: Secondary | ICD-10-CM | POA: Diagnosis not present

## 2017-01-09 DIAGNOSIS — J449 Chronic obstructive pulmonary disease, unspecified: Secondary | ICD-10-CM | POA: Diagnosis not present

## 2017-01-09 DIAGNOSIS — E119 Type 2 diabetes mellitus without complications: Secondary | ICD-10-CM | POA: Diagnosis not present

## 2017-01-09 DIAGNOSIS — M109 Gout, unspecified: Secondary | ICD-10-CM | POA: Diagnosis not present

## 2017-01-09 DIAGNOSIS — I10 Essential (primary) hypertension: Secondary | ICD-10-CM | POA: Diagnosis not present

## 2017-01-09 DIAGNOSIS — Z931 Gastrostomy status: Secondary | ICD-10-CM | POA: Diagnosis not present

## 2017-01-12 DIAGNOSIS — I10 Essential (primary) hypertension: Secondary | ICD-10-CM | POA: Diagnosis not present

## 2017-01-12 DIAGNOSIS — C3412 Malignant neoplasm of upper lobe, left bronchus or lung: Secondary | ICD-10-CM | POA: Diagnosis not present

## 2017-01-12 DIAGNOSIS — Z87891 Personal history of nicotine dependence: Secondary | ICD-10-CM | POA: Diagnosis not present

## 2017-01-12 DIAGNOSIS — Z931 Gastrostomy status: Secondary | ICD-10-CM | POA: Diagnosis not present

## 2017-01-12 DIAGNOSIS — E119 Type 2 diabetes mellitus without complications: Secondary | ICD-10-CM | POA: Diagnosis not present

## 2017-01-12 DIAGNOSIS — J449 Chronic obstructive pulmonary disease, unspecified: Secondary | ICD-10-CM | POA: Diagnosis not present

## 2017-01-12 DIAGNOSIS — M109 Gout, unspecified: Secondary | ICD-10-CM | POA: Diagnosis not present

## 2017-01-12 DIAGNOSIS — Z9181 History of falling: Secondary | ICD-10-CM | POA: Diagnosis not present

## 2017-01-12 DIAGNOSIS — Z7984 Long term (current) use of oral hypoglycemic drugs: Secondary | ICD-10-CM | POA: Diagnosis not present

## 2017-01-14 DIAGNOSIS — Z87891 Personal history of nicotine dependence: Secondary | ICD-10-CM | POA: Diagnosis not present

## 2017-01-14 DIAGNOSIS — I10 Essential (primary) hypertension: Secondary | ICD-10-CM | POA: Diagnosis not present

## 2017-01-14 DIAGNOSIS — Z931 Gastrostomy status: Secondary | ICD-10-CM | POA: Diagnosis not present

## 2017-01-14 DIAGNOSIS — Z7984 Long term (current) use of oral hypoglycemic drugs: Secondary | ICD-10-CM | POA: Diagnosis not present

## 2017-01-14 DIAGNOSIS — E119 Type 2 diabetes mellitus without complications: Secondary | ICD-10-CM | POA: Diagnosis not present

## 2017-01-14 DIAGNOSIS — C3412 Malignant neoplasm of upper lobe, left bronchus or lung: Secondary | ICD-10-CM | POA: Diagnosis not present

## 2017-01-14 DIAGNOSIS — Z9181 History of falling: Secondary | ICD-10-CM | POA: Diagnosis not present

## 2017-01-14 DIAGNOSIS — J449 Chronic obstructive pulmonary disease, unspecified: Secondary | ICD-10-CM | POA: Diagnosis not present

## 2017-01-14 DIAGNOSIS — M109 Gout, unspecified: Secondary | ICD-10-CM | POA: Diagnosis not present

## 2017-01-15 DIAGNOSIS — J9611 Chronic respiratory failure with hypoxia: Secondary | ICD-10-CM | POA: Diagnosis not present

## 2017-01-15 DIAGNOSIS — C3412 Malignant neoplasm of upper lobe, left bronchus or lung: Secondary | ICD-10-CM | POA: Diagnosis not present

## 2017-01-15 DIAGNOSIS — Z95828 Presence of other vascular implants and grafts: Secondary | ICD-10-CM | POA: Diagnosis not present

## 2017-01-15 DIAGNOSIS — J90 Pleural effusion, not elsewhere classified: Secondary | ICD-10-CM | POA: Diagnosis not present

## 2017-01-15 DIAGNOSIS — R900 Intracranial space-occupying lesion found on diagnostic imaging of central nervous system: Secondary | ICD-10-CM | POA: Diagnosis not present

## 2017-01-15 DIAGNOSIS — R634 Abnormal weight loss: Secondary | ICD-10-CM | POA: Diagnosis not present

## 2017-01-15 DIAGNOSIS — Z7901 Long term (current) use of anticoagulants: Secondary | ICD-10-CM | POA: Diagnosis not present

## 2017-01-15 DIAGNOSIS — Z5181 Encounter for therapeutic drug level monitoring: Secondary | ICD-10-CM | POA: Diagnosis not present

## 2017-01-15 DIAGNOSIS — Z931 Gastrostomy status: Secondary | ICD-10-CM | POA: Diagnosis not present

## 2017-01-15 DIAGNOSIS — J449 Chronic obstructive pulmonary disease, unspecified: Secondary | ICD-10-CM | POA: Diagnosis not present

## 2017-01-15 DIAGNOSIS — I5022 Chronic systolic (congestive) heart failure: Secondary | ICD-10-CM | POA: Diagnosis not present

## 2017-01-15 DIAGNOSIS — Z9981 Dependence on supplemental oxygen: Secondary | ICD-10-CM | POA: Diagnosis not present

## 2017-01-20 DIAGNOSIS — R05 Cough: Secondary | ICD-10-CM | POA: Diagnosis not present

## 2017-01-20 DIAGNOSIS — G936 Cerebral edema: Secondary | ICD-10-CM | POA: Diagnosis not present

## 2017-01-20 DIAGNOSIS — R1310 Dysphagia, unspecified: Secondary | ICD-10-CM | POA: Diagnosis not present

## 2017-01-20 DIAGNOSIS — R900 Intracranial space-occupying lesion found on diagnostic imaging of central nervous system: Secondary | ICD-10-CM | POA: Diagnosis not present

## 2017-01-20 DIAGNOSIS — R918 Other nonspecific abnormal finding of lung field: Secondary | ICD-10-CM | POA: Diagnosis not present

## 2017-01-20 DIAGNOSIS — I509 Heart failure, unspecified: Secondary | ICD-10-CM | POA: Diagnosis not present

## 2017-01-20 DIAGNOSIS — R569 Unspecified convulsions: Secondary | ICD-10-CM | POA: Diagnosis not present

## 2017-01-20 DIAGNOSIS — I5022 Chronic systolic (congestive) heart failure: Secondary | ICD-10-CM | POA: Diagnosis not present

## 2017-01-20 DIAGNOSIS — C3412 Malignant neoplasm of upper lobe, left bronchus or lung: Secondary | ICD-10-CM | POA: Diagnosis not present

## 2017-01-20 DIAGNOSIS — G935 Compression of brain: Secondary | ICD-10-CM | POA: Diagnosis not present

## 2017-01-20 DIAGNOSIS — J9611 Chronic respiratory failure with hypoxia: Secondary | ICD-10-CM | POA: Diagnosis not present

## 2017-01-20 DIAGNOSIS — G319 Degenerative disease of nervous system, unspecified: Secondary | ICD-10-CM | POA: Diagnosis not present

## 2017-01-20 DIAGNOSIS — Z85118 Personal history of other malignant neoplasm of bronchus and lung: Secondary | ICD-10-CM | POA: Diagnosis not present

## 2017-01-20 DIAGNOSIS — G939 Disorder of brain, unspecified: Secondary | ICD-10-CM | POA: Diagnosis not present

## 2017-01-20 DIAGNOSIS — J90 Pleural effusion, not elsewhere classified: Secondary | ICD-10-CM | POA: Diagnosis not present

## 2017-01-20 DIAGNOSIS — E119 Type 2 diabetes mellitus without complications: Secondary | ICD-10-CM | POA: Diagnosis not present

## 2017-01-20 DIAGNOSIS — R4701 Aphasia: Secondary | ICD-10-CM | POA: Diagnosis not present

## 2017-01-20 DIAGNOSIS — R296 Repeated falls: Secondary | ICD-10-CM | POA: Diagnosis not present

## 2017-01-20 DIAGNOSIS — Z9981 Dependence on supplemental oxygen: Secondary | ICD-10-CM | POA: Diagnosis not present

## 2017-01-20 DIAGNOSIS — G9340 Encephalopathy, unspecified: Secondary | ICD-10-CM | POA: Diagnosis not present

## 2017-01-20 DIAGNOSIS — I110 Hypertensive heart disease with heart failure: Secondary | ICD-10-CM | POA: Diagnosis not present

## 2017-01-20 DIAGNOSIS — J811 Chronic pulmonary edema: Secondary | ICD-10-CM | POA: Diagnosis not present

## 2017-01-20 DIAGNOSIS — S0990XA Unspecified injury of head, initial encounter: Secondary | ICD-10-CM | POA: Diagnosis not present

## 2017-01-20 DIAGNOSIS — C3490 Malignant neoplasm of unspecified part of unspecified bronchus or lung: Secondary | ICD-10-CM | POA: Diagnosis not present

## 2017-01-20 DIAGNOSIS — S0003XA Contusion of scalp, initial encounter: Secondary | ICD-10-CM | POA: Diagnosis not present

## 2017-01-20 DIAGNOSIS — S0091XA Abrasion of unspecified part of head, initial encounter: Secondary | ICD-10-CM | POA: Diagnosis not present

## 2017-01-20 DIAGNOSIS — C7931 Secondary malignant neoplasm of brain: Secondary | ICD-10-CM | POA: Diagnosis not present

## 2017-01-20 DIAGNOSIS — J449 Chronic obstructive pulmonary disease, unspecified: Secondary | ICD-10-CM | POA: Diagnosis not present

## 2017-01-20 DIAGNOSIS — Z923 Personal history of irradiation: Secondary | ICD-10-CM | POA: Diagnosis not present

## 2017-01-20 DIAGNOSIS — R531 Weakness: Secondary | ICD-10-CM | POA: Diagnosis not present

## 2017-01-25 DIAGNOSIS — Z7984 Long term (current) use of oral hypoglycemic drugs: Secondary | ICD-10-CM | POA: Diagnosis not present

## 2017-01-25 DIAGNOSIS — Z87891 Personal history of nicotine dependence: Secondary | ICD-10-CM | POA: Diagnosis not present

## 2017-01-25 DIAGNOSIS — Z931 Gastrostomy status: Secondary | ICD-10-CM | POA: Diagnosis not present

## 2017-01-25 DIAGNOSIS — C3412 Malignant neoplasm of upper lobe, left bronchus or lung: Secondary | ICD-10-CM | POA: Diagnosis not present

## 2017-01-25 DIAGNOSIS — E119 Type 2 diabetes mellitus without complications: Secondary | ICD-10-CM | POA: Diagnosis not present

## 2017-01-25 DIAGNOSIS — I10 Essential (primary) hypertension: Secondary | ICD-10-CM | POA: Diagnosis not present

## 2017-01-25 DIAGNOSIS — M109 Gout, unspecified: Secondary | ICD-10-CM | POA: Diagnosis not present

## 2017-01-25 DIAGNOSIS — J449 Chronic obstructive pulmonary disease, unspecified: Secondary | ICD-10-CM | POA: Diagnosis not present

## 2017-01-25 DIAGNOSIS — Z9181 History of falling: Secondary | ICD-10-CM | POA: Diagnosis not present

## 2017-01-26 DIAGNOSIS — Z87891 Personal history of nicotine dependence: Secondary | ICD-10-CM | POA: Diagnosis not present

## 2017-01-26 DIAGNOSIS — Z9181 History of falling: Secondary | ICD-10-CM | POA: Diagnosis not present

## 2017-01-26 DIAGNOSIS — Z7984 Long term (current) use of oral hypoglycemic drugs: Secondary | ICD-10-CM | POA: Diagnosis not present

## 2017-01-26 DIAGNOSIS — M109 Gout, unspecified: Secondary | ICD-10-CM | POA: Diagnosis not present

## 2017-01-26 DIAGNOSIS — I10 Essential (primary) hypertension: Secondary | ICD-10-CM | POA: Diagnosis not present

## 2017-01-26 DIAGNOSIS — J449 Chronic obstructive pulmonary disease, unspecified: Secondary | ICD-10-CM | POA: Diagnosis not present

## 2017-01-26 DIAGNOSIS — E119 Type 2 diabetes mellitus without complications: Secondary | ICD-10-CM | POA: Diagnosis not present

## 2017-01-26 DIAGNOSIS — C3412 Malignant neoplasm of upper lobe, left bronchus or lung: Secondary | ICD-10-CM | POA: Diagnosis not present

## 2017-01-26 DIAGNOSIS — Z931 Gastrostomy status: Secondary | ICD-10-CM | POA: Diagnosis not present

## 2017-01-27 DIAGNOSIS — Z87891 Personal history of nicotine dependence: Secondary | ICD-10-CM | POA: Diagnosis not present

## 2017-01-27 DIAGNOSIS — Z9181 History of falling: Secondary | ICD-10-CM | POA: Diagnosis not present

## 2017-01-27 DIAGNOSIS — J449 Chronic obstructive pulmonary disease, unspecified: Secondary | ICD-10-CM | POA: Diagnosis not present

## 2017-01-27 DIAGNOSIS — Z7984 Long term (current) use of oral hypoglycemic drugs: Secondary | ICD-10-CM | POA: Diagnosis not present

## 2017-01-27 DIAGNOSIS — Z931 Gastrostomy status: Secondary | ICD-10-CM | POA: Diagnosis not present

## 2017-01-27 DIAGNOSIS — C3412 Malignant neoplasm of upper lobe, left bronchus or lung: Secondary | ICD-10-CM | POA: Diagnosis not present

## 2017-01-27 DIAGNOSIS — M109 Gout, unspecified: Secondary | ICD-10-CM | POA: Diagnosis not present

## 2017-01-27 DIAGNOSIS — E119 Type 2 diabetes mellitus without complications: Secondary | ICD-10-CM | POA: Diagnosis not present

## 2017-01-27 DIAGNOSIS — I10 Essential (primary) hypertension: Secondary | ICD-10-CM | POA: Diagnosis not present

## 2017-01-28 DIAGNOSIS — C7931 Secondary malignant neoplasm of brain: Secondary | ICD-10-CM | POA: Diagnosis not present

## 2017-01-28 DIAGNOSIS — E119 Type 2 diabetes mellitus without complications: Secondary | ICD-10-CM | POA: Diagnosis not present

## 2017-01-28 DIAGNOSIS — C3412 Malignant neoplasm of upper lobe, left bronchus or lung: Secondary | ICD-10-CM | POA: Diagnosis not present

## 2017-01-28 DIAGNOSIS — Z7984 Long term (current) use of oral hypoglycemic drugs: Secondary | ICD-10-CM | POA: Diagnosis not present

## 2017-01-28 DIAGNOSIS — Z9181 History of falling: Secondary | ICD-10-CM | POA: Diagnosis not present

## 2017-01-28 DIAGNOSIS — Z931 Gastrostomy status: Secondary | ICD-10-CM | POA: Diagnosis not present

## 2017-01-28 DIAGNOSIS — J449 Chronic obstructive pulmonary disease, unspecified: Secondary | ICD-10-CM | POA: Diagnosis not present

## 2017-01-28 DIAGNOSIS — Z87891 Personal history of nicotine dependence: Secondary | ICD-10-CM | POA: Diagnosis not present

## 2017-01-28 DIAGNOSIS — Z510 Encounter for antineoplastic radiation therapy: Secondary | ICD-10-CM | POA: Diagnosis not present

## 2017-01-28 DIAGNOSIS — M109 Gout, unspecified: Secondary | ICD-10-CM | POA: Diagnosis not present

## 2017-01-28 DIAGNOSIS — I10 Essential (primary) hypertension: Secondary | ICD-10-CM | POA: Diagnosis not present

## 2017-01-29 DIAGNOSIS — C7931 Secondary malignant neoplasm of brain: Secondary | ICD-10-CM | POA: Diagnosis not present

## 2017-01-29 DIAGNOSIS — C3412 Malignant neoplasm of upper lobe, left bronchus or lung: Secondary | ICD-10-CM | POA: Diagnosis not present

## 2017-01-29 DIAGNOSIS — Z510 Encounter for antineoplastic radiation therapy: Secondary | ICD-10-CM | POA: Diagnosis not present

## 2017-01-30 DIAGNOSIS — C7931 Secondary malignant neoplasm of brain: Secondary | ICD-10-CM | POA: Diagnosis not present

## 2017-01-30 DIAGNOSIS — J449 Chronic obstructive pulmonary disease, unspecified: Secondary | ICD-10-CM | POA: Diagnosis not present

## 2017-01-30 DIAGNOSIS — Z510 Encounter for antineoplastic radiation therapy: Secondary | ICD-10-CM | POA: Diagnosis not present

## 2017-01-30 DIAGNOSIS — I959 Hypotension, unspecified: Secondary | ICD-10-CM | POA: Diagnosis not present

## 2017-01-30 DIAGNOSIS — I5022 Chronic systolic (congestive) heart failure: Secondary | ICD-10-CM | POA: Diagnosis not present

## 2017-01-30 DIAGNOSIS — J9611 Chronic respiratory failure with hypoxia: Secondary | ICD-10-CM | POA: Diagnosis not present

## 2017-01-30 DIAGNOSIS — Z7901 Long term (current) use of anticoagulants: Secondary | ICD-10-CM | POA: Diagnosis not present

## 2017-01-30 DIAGNOSIS — C3412 Malignant neoplasm of upper lobe, left bronchus or lung: Secondary | ICD-10-CM | POA: Diagnosis not present

## 2017-01-30 DIAGNOSIS — J90 Pleural effusion, not elsewhere classified: Secondary | ICD-10-CM | POA: Diagnosis not present

## 2017-01-30 DIAGNOSIS — R900 Intracranial space-occupying lesion found on diagnostic imaging of central nervous system: Secondary | ICD-10-CM | POA: Diagnosis not present

## 2017-02-02 DIAGNOSIS — E119 Type 2 diabetes mellitus without complications: Secondary | ICD-10-CM | POA: Diagnosis not present

## 2017-02-02 DIAGNOSIS — Z87891 Personal history of nicotine dependence: Secondary | ICD-10-CM | POA: Diagnosis not present

## 2017-02-02 DIAGNOSIS — Z510 Encounter for antineoplastic radiation therapy: Secondary | ICD-10-CM | POA: Diagnosis not present

## 2017-02-02 DIAGNOSIS — Z9181 History of falling: Secondary | ICD-10-CM | POA: Diagnosis not present

## 2017-02-02 DIAGNOSIS — J449 Chronic obstructive pulmonary disease, unspecified: Secondary | ICD-10-CM | POA: Diagnosis not present

## 2017-02-02 DIAGNOSIS — Z931 Gastrostomy status: Secondary | ICD-10-CM | POA: Diagnosis not present

## 2017-02-02 DIAGNOSIS — M109 Gout, unspecified: Secondary | ICD-10-CM | POA: Diagnosis not present

## 2017-02-02 DIAGNOSIS — C7931 Secondary malignant neoplasm of brain: Secondary | ICD-10-CM | POA: Diagnosis not present

## 2017-02-02 DIAGNOSIS — C3412 Malignant neoplasm of upper lobe, left bronchus or lung: Secondary | ICD-10-CM | POA: Diagnosis not present

## 2017-02-02 DIAGNOSIS — I10 Essential (primary) hypertension: Secondary | ICD-10-CM | POA: Diagnosis not present

## 2017-02-02 DIAGNOSIS — Z7984 Long term (current) use of oral hypoglycemic drugs: Secondary | ICD-10-CM | POA: Diagnosis not present

## 2017-02-03 DIAGNOSIS — E119 Type 2 diabetes mellitus without complications: Secondary | ICD-10-CM | POA: Diagnosis not present

## 2017-02-03 DIAGNOSIS — J449 Chronic obstructive pulmonary disease, unspecified: Secondary | ICD-10-CM | POA: Diagnosis not present

## 2017-02-03 DIAGNOSIS — Z931 Gastrostomy status: Secondary | ICD-10-CM | POA: Diagnosis not present

## 2017-02-03 DIAGNOSIS — C3412 Malignant neoplasm of upper lobe, left bronchus or lung: Secondary | ICD-10-CM | POA: Diagnosis not present

## 2017-02-03 DIAGNOSIS — M109 Gout, unspecified: Secondary | ICD-10-CM | POA: Diagnosis not present

## 2017-02-03 DIAGNOSIS — Z9181 History of falling: Secondary | ICD-10-CM | POA: Diagnosis not present

## 2017-02-03 DIAGNOSIS — Z7984 Long term (current) use of oral hypoglycemic drugs: Secondary | ICD-10-CM | POA: Diagnosis not present

## 2017-02-03 DIAGNOSIS — I10 Essential (primary) hypertension: Secondary | ICD-10-CM | POA: Diagnosis not present

## 2017-02-03 DIAGNOSIS — Z87891 Personal history of nicotine dependence: Secondary | ICD-10-CM | POA: Diagnosis not present

## 2017-02-05 DIAGNOSIS — R918 Other nonspecific abnormal finding of lung field: Secondary | ICD-10-CM | POA: Diagnosis not present

## 2017-02-05 DIAGNOSIS — C3412 Malignant neoplasm of upper lobe, left bronchus or lung: Secondary | ICD-10-CM | POA: Diagnosis not present

## 2017-02-06 DIAGNOSIS — I10 Essential (primary) hypertension: Secondary | ICD-10-CM | POA: Diagnosis not present

## 2017-02-06 DIAGNOSIS — Z931 Gastrostomy status: Secondary | ICD-10-CM | POA: Diagnosis not present

## 2017-02-06 DIAGNOSIS — J449 Chronic obstructive pulmonary disease, unspecified: Secondary | ICD-10-CM | POA: Diagnosis not present

## 2017-02-06 DIAGNOSIS — C3412 Malignant neoplasm of upper lobe, left bronchus or lung: Secondary | ICD-10-CM | POA: Diagnosis not present

## 2017-02-06 DIAGNOSIS — Z87891 Personal history of nicotine dependence: Secondary | ICD-10-CM | POA: Diagnosis not present

## 2017-02-06 DIAGNOSIS — Z7984 Long term (current) use of oral hypoglycemic drugs: Secondary | ICD-10-CM | POA: Diagnosis not present

## 2017-02-06 DIAGNOSIS — E119 Type 2 diabetes mellitus without complications: Secondary | ICD-10-CM | POA: Diagnosis not present

## 2017-02-06 DIAGNOSIS — M109 Gout, unspecified: Secondary | ICD-10-CM | POA: Diagnosis not present

## 2017-02-06 DIAGNOSIS — Z9181 History of falling: Secondary | ICD-10-CM | POA: Diagnosis not present

## 2017-02-09 DIAGNOSIS — E119 Type 2 diabetes mellitus without complications: Secondary | ICD-10-CM | POA: Diagnosis not present

## 2017-02-09 DIAGNOSIS — Z931 Gastrostomy status: Secondary | ICD-10-CM | POA: Diagnosis not present

## 2017-02-09 DIAGNOSIS — M109 Gout, unspecified: Secondary | ICD-10-CM | POA: Diagnosis not present

## 2017-02-09 DIAGNOSIS — C7931 Secondary malignant neoplasm of brain: Secondary | ICD-10-CM | POA: Diagnosis not present

## 2017-02-09 DIAGNOSIS — J449 Chronic obstructive pulmonary disease, unspecified: Secondary | ICD-10-CM | POA: Diagnosis not present

## 2017-02-09 DIAGNOSIS — Z87891 Personal history of nicotine dependence: Secondary | ICD-10-CM | POA: Diagnosis not present

## 2017-02-09 DIAGNOSIS — Z7984 Long term (current) use of oral hypoglycemic drugs: Secondary | ICD-10-CM | POA: Diagnosis not present

## 2017-02-09 DIAGNOSIS — I10 Essential (primary) hypertension: Secondary | ICD-10-CM | POA: Diagnosis not present

## 2017-02-09 DIAGNOSIS — C3412 Malignant neoplasm of upper lobe, left bronchus or lung: Secondary | ICD-10-CM | POA: Diagnosis not present

## 2017-02-10 DIAGNOSIS — Z87891 Personal history of nicotine dependence: Secondary | ICD-10-CM | POA: Diagnosis not present

## 2017-02-10 DIAGNOSIS — C7931 Secondary malignant neoplasm of brain: Secondary | ICD-10-CM | POA: Diagnosis not present

## 2017-02-10 DIAGNOSIS — Z7984 Long term (current) use of oral hypoglycemic drugs: Secondary | ICD-10-CM | POA: Diagnosis not present

## 2017-02-10 DIAGNOSIS — J449 Chronic obstructive pulmonary disease, unspecified: Secondary | ICD-10-CM | POA: Diagnosis not present

## 2017-02-10 DIAGNOSIS — Z931 Gastrostomy status: Secondary | ICD-10-CM | POA: Diagnosis not present

## 2017-02-10 DIAGNOSIS — M109 Gout, unspecified: Secondary | ICD-10-CM | POA: Diagnosis not present

## 2017-02-10 DIAGNOSIS — C3412 Malignant neoplasm of upper lobe, left bronchus or lung: Secondary | ICD-10-CM | POA: Diagnosis not present

## 2017-02-10 DIAGNOSIS — E119 Type 2 diabetes mellitus without complications: Secondary | ICD-10-CM | POA: Diagnosis not present

## 2017-02-10 DIAGNOSIS — I10 Essential (primary) hypertension: Secondary | ICD-10-CM | POA: Diagnosis not present

## 2017-02-12 DIAGNOSIS — I10 Essential (primary) hypertension: Secondary | ICD-10-CM | POA: Diagnosis not present

## 2017-02-12 DIAGNOSIS — Z87891 Personal history of nicotine dependence: Secondary | ICD-10-CM | POA: Diagnosis not present

## 2017-02-12 DIAGNOSIS — E119 Type 2 diabetes mellitus without complications: Secondary | ICD-10-CM | POA: Diagnosis not present

## 2017-02-12 DIAGNOSIS — Z7984 Long term (current) use of oral hypoglycemic drugs: Secondary | ICD-10-CM | POA: Diagnosis not present

## 2017-02-12 DIAGNOSIS — C7931 Secondary malignant neoplasm of brain: Secondary | ICD-10-CM | POA: Diagnosis not present

## 2017-02-12 DIAGNOSIS — M109 Gout, unspecified: Secondary | ICD-10-CM | POA: Diagnosis not present

## 2017-02-12 DIAGNOSIS — C3412 Malignant neoplasm of upper lobe, left bronchus or lung: Secondary | ICD-10-CM | POA: Diagnosis not present

## 2017-02-12 DIAGNOSIS — Z931 Gastrostomy status: Secondary | ICD-10-CM | POA: Diagnosis not present

## 2017-02-12 DIAGNOSIS — J449 Chronic obstructive pulmonary disease, unspecified: Secondary | ICD-10-CM | POA: Diagnosis not present

## 2017-02-17 DIAGNOSIS — Z87891 Personal history of nicotine dependence: Secondary | ICD-10-CM | POA: Diagnosis not present

## 2017-02-17 DIAGNOSIS — J449 Chronic obstructive pulmonary disease, unspecified: Secondary | ICD-10-CM | POA: Diagnosis not present

## 2017-02-17 DIAGNOSIS — I10 Essential (primary) hypertension: Secondary | ICD-10-CM | POA: Diagnosis not present

## 2017-02-17 DIAGNOSIS — Z7984 Long term (current) use of oral hypoglycemic drugs: Secondary | ICD-10-CM | POA: Diagnosis not present

## 2017-02-17 DIAGNOSIS — C7931 Secondary malignant neoplasm of brain: Secondary | ICD-10-CM | POA: Diagnosis not present

## 2017-02-17 DIAGNOSIS — M109 Gout, unspecified: Secondary | ICD-10-CM | POA: Diagnosis not present

## 2017-02-17 DIAGNOSIS — Z931 Gastrostomy status: Secondary | ICD-10-CM | POA: Diagnosis not present

## 2017-02-17 DIAGNOSIS — E119 Type 2 diabetes mellitus without complications: Secondary | ICD-10-CM | POA: Diagnosis not present

## 2017-02-17 DIAGNOSIS — C3412 Malignant neoplasm of upper lobe, left bronchus or lung: Secondary | ICD-10-CM | POA: Diagnosis not present

## 2017-02-20 DIAGNOSIS — M109 Gout, unspecified: Secondary | ICD-10-CM | POA: Diagnosis not present

## 2017-02-20 DIAGNOSIS — C7931 Secondary malignant neoplasm of brain: Secondary | ICD-10-CM | POA: Diagnosis not present

## 2017-02-20 DIAGNOSIS — Z931 Gastrostomy status: Secondary | ICD-10-CM | POA: Diagnosis not present

## 2017-02-20 DIAGNOSIS — Z7984 Long term (current) use of oral hypoglycemic drugs: Secondary | ICD-10-CM | POA: Diagnosis not present

## 2017-02-20 DIAGNOSIS — E119 Type 2 diabetes mellitus without complications: Secondary | ICD-10-CM | POA: Diagnosis not present

## 2017-02-20 DIAGNOSIS — J449 Chronic obstructive pulmonary disease, unspecified: Secondary | ICD-10-CM | POA: Diagnosis not present

## 2017-02-20 DIAGNOSIS — I10 Essential (primary) hypertension: Secondary | ICD-10-CM | POA: Diagnosis not present

## 2017-02-20 DIAGNOSIS — C3412 Malignant neoplasm of upper lobe, left bronchus or lung: Secondary | ICD-10-CM | POA: Diagnosis not present

## 2017-02-20 DIAGNOSIS — Z87891 Personal history of nicotine dependence: Secondary | ICD-10-CM | POA: Diagnosis not present

## 2017-02-24 DIAGNOSIS — Z87891 Personal history of nicotine dependence: Secondary | ICD-10-CM | POA: Diagnosis not present

## 2017-02-24 DIAGNOSIS — E119 Type 2 diabetes mellitus without complications: Secondary | ICD-10-CM | POA: Diagnosis not present

## 2017-02-24 DIAGNOSIS — C7931 Secondary malignant neoplasm of brain: Secondary | ICD-10-CM | POA: Diagnosis not present

## 2017-02-24 DIAGNOSIS — Z7984 Long term (current) use of oral hypoglycemic drugs: Secondary | ICD-10-CM | POA: Diagnosis not present

## 2017-02-24 DIAGNOSIS — J449 Chronic obstructive pulmonary disease, unspecified: Secondary | ICD-10-CM | POA: Diagnosis not present

## 2017-02-24 DIAGNOSIS — I10 Essential (primary) hypertension: Secondary | ICD-10-CM | POA: Diagnosis not present

## 2017-02-24 DIAGNOSIS — Z931 Gastrostomy status: Secondary | ICD-10-CM | POA: Diagnosis not present

## 2017-02-24 DIAGNOSIS — M109 Gout, unspecified: Secondary | ICD-10-CM | POA: Diagnosis not present

## 2017-02-24 DIAGNOSIS — C3412 Malignant neoplasm of upper lobe, left bronchus or lung: Secondary | ICD-10-CM | POA: Diagnosis not present

## 2017-02-25 ENCOUNTER — Other Ambulatory Visit: Payer: Self-pay | Admitting: Unknown Physician Specialty

## 2017-02-25 DIAGNOSIS — R059 Cough, unspecified: Secondary | ICD-10-CM

## 2017-02-25 DIAGNOSIS — R05 Cough: Secondary | ICD-10-CM

## 2017-02-27 DIAGNOSIS — M109 Gout, unspecified: Secondary | ICD-10-CM | POA: Diagnosis not present

## 2017-02-27 DIAGNOSIS — Z7984 Long term (current) use of oral hypoglycemic drugs: Secondary | ICD-10-CM | POA: Diagnosis not present

## 2017-02-27 DIAGNOSIS — C7931 Secondary malignant neoplasm of brain: Secondary | ICD-10-CM | POA: Diagnosis not present

## 2017-02-27 DIAGNOSIS — J449 Chronic obstructive pulmonary disease, unspecified: Secondary | ICD-10-CM | POA: Diagnosis not present

## 2017-02-27 DIAGNOSIS — I10 Essential (primary) hypertension: Secondary | ICD-10-CM | POA: Diagnosis not present

## 2017-02-27 DIAGNOSIS — Z931 Gastrostomy status: Secondary | ICD-10-CM | POA: Diagnosis not present

## 2017-02-27 DIAGNOSIS — C3412 Malignant neoplasm of upper lobe, left bronchus or lung: Secondary | ICD-10-CM | POA: Diagnosis not present

## 2017-02-27 DIAGNOSIS — E119 Type 2 diabetes mellitus without complications: Secondary | ICD-10-CM | POA: Diagnosis not present

## 2017-02-27 DIAGNOSIS — Z87891 Personal history of nicotine dependence: Secondary | ICD-10-CM | POA: Diagnosis not present

## 2017-03-03 DIAGNOSIS — I4891 Unspecified atrial fibrillation: Secondary | ICD-10-CM | POA: Diagnosis not present

## 2017-03-03 DIAGNOSIS — R531 Weakness: Secondary | ICD-10-CM | POA: Diagnosis not present

## 2017-03-03 DIAGNOSIS — I517 Cardiomegaly: Secondary | ICD-10-CM | POA: Diagnosis not present

## 2017-03-03 DIAGNOSIS — I5023 Acute on chronic systolic (congestive) heart failure: Secondary | ICD-10-CM | POA: Diagnosis not present

## 2017-03-03 DIAGNOSIS — J918 Pleural effusion in other conditions classified elsewhere: Secondary | ICD-10-CM | POA: Diagnosis not present

## 2017-03-03 DIAGNOSIS — R0603 Acute respiratory distress: Secondary | ICD-10-CM | POA: Diagnosis not present

## 2017-03-03 DIAGNOSIS — N390 Urinary tract infection, site not specified: Secondary | ICD-10-CM | POA: Diagnosis not present

## 2017-03-03 DIAGNOSIS — Z9221 Personal history of antineoplastic chemotherapy: Secondary | ICD-10-CM | POA: Diagnosis not present

## 2017-03-03 DIAGNOSIS — I483 Typical atrial flutter: Secondary | ICD-10-CM | POA: Diagnosis not present

## 2017-03-03 DIAGNOSIS — I080 Rheumatic disorders of both mitral and aortic valves: Secondary | ICD-10-CM | POA: Diagnosis not present

## 2017-03-03 DIAGNOSIS — N3000 Acute cystitis without hematuria: Secondary | ICD-10-CM | POA: Diagnosis not present

## 2017-03-03 DIAGNOSIS — R000 Tachycardia, unspecified: Secondary | ICD-10-CM | POA: Diagnosis not present

## 2017-03-03 DIAGNOSIS — J849 Interstitial pulmonary disease, unspecified: Secondary | ICD-10-CM | POA: Diagnosis not present

## 2017-03-03 DIAGNOSIS — W06XXXA Fall from bed, initial encounter: Secondary | ICD-10-CM | POA: Diagnosis not present

## 2017-03-03 DIAGNOSIS — E8770 Fluid overload, unspecified: Secondary | ICD-10-CM | POA: Diagnosis not present

## 2017-03-03 DIAGNOSIS — R0602 Shortness of breath: Secondary | ICD-10-CM | POA: Diagnosis not present

## 2017-03-03 DIAGNOSIS — R509 Fever, unspecified: Secondary | ICD-10-CM | POA: Diagnosis not present

## 2017-03-03 DIAGNOSIS — I110 Hypertensive heart disease with heart failure: Secondary | ICD-10-CM | POA: Diagnosis not present

## 2017-03-03 DIAGNOSIS — J929 Pleural plaque without asbestos: Secondary | ICD-10-CM | POA: Diagnosis not present

## 2017-03-03 DIAGNOSIS — R918 Other nonspecific abnormal finding of lung field: Secondary | ICD-10-CM | POA: Diagnosis not present

## 2017-03-03 DIAGNOSIS — J90 Pleural effusion, not elsewhere classified: Secondary | ICD-10-CM | POA: Diagnosis not present

## 2017-03-03 DIAGNOSIS — J9611 Chronic respiratory failure with hypoxia: Secondary | ICD-10-CM | POA: Diagnosis not present

## 2017-03-03 DIAGNOSIS — J910 Malignant pleural effusion: Secondary | ICD-10-CM | POA: Diagnosis not present

## 2017-03-03 DIAGNOSIS — I4892 Unspecified atrial flutter: Secondary | ICD-10-CM | POA: Diagnosis not present

## 2017-03-03 DIAGNOSIS — C3490 Malignant neoplasm of unspecified part of unspecified bronchus or lung: Secondary | ICD-10-CM | POA: Diagnosis not present

## 2017-03-03 DIAGNOSIS — C3412 Malignant neoplasm of upper lobe, left bronchus or lung: Secondary | ICD-10-CM | POA: Diagnosis not present

## 2017-03-03 DIAGNOSIS — R404 Transient alteration of awareness: Secondary | ICD-10-CM | POA: Diagnosis not present

## 2017-03-03 DIAGNOSIS — I700 Atherosclerosis of aorta: Secondary | ICD-10-CM | POA: Diagnosis not present

## 2017-03-03 DIAGNOSIS — C7931 Secondary malignant neoplasm of brain: Secondary | ICD-10-CM | POA: Diagnosis not present

## 2017-03-03 DIAGNOSIS — I5189 Other ill-defined heart diseases: Secondary | ICD-10-CM | POA: Diagnosis not present

## 2017-03-03 DIAGNOSIS — Z9889 Other specified postprocedural states: Secondary | ICD-10-CM | POA: Diagnosis not present

## 2017-04-07 DIAGNOSIS — C3412 Malignant neoplasm of upper lobe, left bronchus or lung: Secondary | ICD-10-CM | POA: Diagnosis not present

## 2017-04-07 DIAGNOSIS — R918 Other nonspecific abnormal finding of lung field: Secondary | ICD-10-CM | POA: Diagnosis not present

## 2017-04-20 DIAGNOSIS — Z431 Encounter for attention to gastrostomy: Secondary | ICD-10-CM | POA: Diagnosis not present

## 2017-04-20 DIAGNOSIS — R1310 Dysphagia, unspecified: Secondary | ICD-10-CM | POA: Diagnosis not present

## 2017-04-20 DIAGNOSIS — Z4682 Encounter for fitting and adjustment of non-vascular catheter: Secondary | ICD-10-CM | POA: Diagnosis not present

## 2017-05-04 DIAGNOSIS — J984 Other disorders of lung: Secondary | ICD-10-CM | POA: Diagnosis not present

## 2017-05-04 DIAGNOSIS — Z48813 Encounter for surgical aftercare following surgery on the respiratory system: Secondary | ICD-10-CM | POA: Diagnosis not present

## 2017-05-04 DIAGNOSIS — H539 Unspecified visual disturbance: Secondary | ICD-10-CM | POA: Diagnosis not present

## 2017-05-04 DIAGNOSIS — J90 Pleural effusion, not elsewhere classified: Secondary | ICD-10-CM | POA: Diagnosis not present

## 2017-05-04 DIAGNOSIS — C3412 Malignant neoplasm of upper lobe, left bronchus or lung: Secondary | ICD-10-CM | POA: Diagnosis not present

## 2017-06-11 DEATH — deceased
